# Patient Record
Sex: Female | Born: 1950 | Race: Black or African American | Hispanic: No | Marital: Married | Smoking: Never smoker
Health system: Southern US, Community
[De-identification: ages and names within clinical notes are randomized; demographics above are authoritative.]

## PROBLEM LIST (undated history)

## (undated) DIAGNOSIS — K829 Disease of gallbladder, unspecified: Secondary | ICD-10-CM

## (undated) DIAGNOSIS — M48061 Spinal stenosis, lumbar region without neurogenic claudication: Secondary | ICD-10-CM

## (undated) DIAGNOSIS — K219 Gastro-esophageal reflux disease without esophagitis: Secondary | ICD-10-CM

## (undated) DIAGNOSIS — R600 Localized edema: Secondary | ICD-10-CM

## (undated) DIAGNOSIS — M1710 Unilateral primary osteoarthritis, unspecified knee: Secondary | ICD-10-CM

## (undated) DIAGNOSIS — H02409 Unspecified ptosis of unspecified eyelid: Secondary | ICD-10-CM

## (undated) DIAGNOSIS — M2550 Pain in unspecified joint: Secondary | ICD-10-CM

## (undated) DIAGNOSIS — K589 Irritable bowel syndrome without diarrhea: Secondary | ICD-10-CM

## (undated) DIAGNOSIS — R0683 Snoring: Secondary | ICD-10-CM

## (undated) DIAGNOSIS — M549 Dorsalgia, unspecified: Secondary | ICD-10-CM

## (undated) DIAGNOSIS — I10 Essential (primary) hypertension: Secondary | ICD-10-CM

## (undated) DIAGNOSIS — R0602 Shortness of breath: Secondary | ICD-10-CM

## (undated) DIAGNOSIS — E559 Vitamin D deficiency, unspecified: Secondary | ICD-10-CM

## (undated) HISTORY — DX: Gastro-esophageal reflux disease without esophagitis: K21.9

## (undated) HISTORY — DX: Irritable bowel syndrome, unspecified: K58.9

## (undated) HISTORY — DX: Localized edema: R60.0

## (undated) HISTORY — DX: Spinal stenosis, lumbar region without neurogenic claudication: M48.061

## (undated) HISTORY — PX: BREAST EXCISIONAL BIOPSY: SUR124

## (undated) HISTORY — DX: Disease of gallbladder, unspecified: K82.9

## (undated) HISTORY — DX: Pain in unspecified joint: M25.50

## (undated) HISTORY — DX: Dorsalgia, unspecified: M54.9

## (undated) HISTORY — DX: Shortness of breath: R06.02

## (undated) HISTORY — DX: Essential (primary) hypertension: I10

## (undated) HISTORY — PX: ORTHOPEDIC SURGERY: SHX850

## (undated) HISTORY — DX: Snoring: R06.83

## (undated) HISTORY — DX: Unilateral primary osteoarthritis, unspecified knee: M17.10

## (undated) HISTORY — DX: Vitamin D deficiency, unspecified: E55.9

## (undated) HISTORY — DX: Unspecified ptosis of unspecified eyelid: H02.409

---

## 1969-04-16 HISTORY — PX: BREAST LUMPECTOMY: SHX2

## 2000-04-16 HISTORY — PX: PARTIAL HYSTERECTOMY: SHX80

## 2002-05-05 ENCOUNTER — Other Ambulatory Visit: Admission: RE | Admit: 2002-05-05 | Discharge: 2002-05-05 | Payer: Self-pay | Admitting: Gynecology

## 2003-04-17 HISTORY — PX: BUNIONECTOMY: SHX129

## 2011-12-10 ENCOUNTER — Other Ambulatory Visit: Payer: Self-pay | Admitting: Physician Assistant

## 2011-12-10 DIAGNOSIS — K8020 Calculus of gallbladder without cholecystitis without obstruction: Secondary | ICD-10-CM

## 2011-12-14 ENCOUNTER — Ambulatory Visit
Admission: RE | Admit: 2011-12-14 | Discharge: 2011-12-14 | Disposition: A | Discharge status: Home / Self Care | Payer: BC Managed Care – PPO | Source: Ambulatory Visit | Attending: Physician Assistant | Admitting: Physician Assistant

## 2011-12-14 DIAGNOSIS — K8020 Calculus of gallbladder without cholecystitis without obstruction: Secondary | ICD-10-CM

## 2012-01-17 ENCOUNTER — Encounter: Payer: Self-pay | Admitting: Obstetrics and Gynecology

## 2012-01-31 ENCOUNTER — Encounter: Payer: Self-pay | Admitting: Obstetrics and Gynecology

## 2012-01-31 ENCOUNTER — Ambulatory Visit (INDEPENDENT_AMBULATORY_CARE_PROVIDER_SITE_OTHER): Payer: BC Managed Care – PPO | Admitting: Obstetrics and Gynecology

## 2012-01-31 VITALS — BP 120/60 | Ht 63.0 in | Wt 218.0 lb

## 2012-01-31 DIAGNOSIS — R635 Abnormal weight gain: Secondary | ICD-10-CM

## 2012-01-31 DIAGNOSIS — N898 Other specified noninflammatory disorders of vagina: Secondary | ICD-10-CM

## 2012-01-31 DIAGNOSIS — Z139 Encounter for screening, unspecified: Secondary | ICD-10-CM

## 2012-01-31 LAB — CBC
HCT: 38.6 % (ref 36.0–46.0)
MCH: 29 pg (ref 26.0–34.0)
MCHC: 33.7 g/dL (ref 30.0–36.0)
MCV: 86.2 fL (ref 78.0–100.0)
RDW: 14.5 % (ref 11.5–15.5)

## 2012-01-31 LAB — COMPREHENSIVE METABOLIC PANEL
ALT: 12 U/L (ref 0–35)
BUN: 18 mg/dL (ref 6–23)
CO2: 25 mEq/L (ref 19–32)
Calcium: 9.6 mg/dL (ref 8.4–10.5)
Chloride: 108 mEq/L (ref 96–112)
Creat: 0.77 mg/dL (ref 0.50–1.10)
Glucose, Bld: 99 mg/dL (ref 70–99)

## 2012-01-31 LAB — POCT WET PREP (WET MOUNT)
Bacteria Wet Prep HPF POC: NEGATIVE
Clue Cells Wet Prep Whiff POC: NEGATIVE

## 2012-01-31 MED ORDER — VALACYCLOVIR HCL 500 MG PO TABS: 500.0000 mg | ORAL_TABLET | Freq: Two times a day (BID) | ORAL | Status: DC

## 2012-01-31 NOTE — Progress Notes (Signed)
Multiple complaints.  Reports h/o genital herpes.  Asks for referral to a PCP.  Filed Vitals:   01/31/12 0921  BP: 120/60    ROS: noncontributory  Pelvic exam:  VULVA: normal appearing vulva with no masses, tenderness or lesions,  VAGINA: normal appearing vagina with normal color and discharge, no lesions, CERVIX: normal appearing cervix without discharge or lesions,  UTERUS: uterus is normal size, shape, consistency and nontender,  ADNEXA: normal adnexa in size, nontender and no masses.  Results for orders placed in visit on 01/31/12  POCT WET PREP (WET [place])      Component Value Range   Source Wet Prep POC       WBC, Wet Prep HPF POC       Bacteria Wet Prep HPF POC neg     BACTERIA WET PREP MORPHOLOGY POC       Clue Cells Wet Prep HPF POC None     CLUE CELLS WET PREP WHIFF POC Negative Whiff     Yeast Wet Prep HPF POC None     KOH Wet Prep POC       Trichomonas Wet Prep HPF POC neg     pH 4.5       A/P Wet prep sched u/s Check labs RTO 1-2wks for u/s and f/u Trial of rephresh Refer to Dr. Allyne Gee

## 2012-02-04 ENCOUNTER — Telehealth: Payer: Self-pay

## 2012-02-04 NOTE — Telephone Encounter (Signed)
Notified pt of Vit D level , low at 27. Vit D softgels 50,000 units 1 PO q week x 12 weeks called to [facility], per protocol. Melody Comas A

## 2012-02-12 ENCOUNTER — Ambulatory Visit (INDEPENDENT_AMBULATORY_CARE_PROVIDER_SITE_OTHER): Payer: BC Managed Care – PPO | Admitting: Obstetrics and Gynecology

## 2012-02-12 ENCOUNTER — Encounter: Payer: Self-pay | Admitting: Obstetrics and Gynecology

## 2012-02-12 ENCOUNTER — Ambulatory Visit (INDEPENDENT_AMBULATORY_CARE_PROVIDER_SITE_OTHER): Payer: BC Managed Care – PPO

## 2012-02-12 ENCOUNTER — Other Ambulatory Visit: Payer: Self-pay | Admitting: Obstetrics and Gynecology

## 2012-02-12 ENCOUNTER — Ambulatory Visit: Payer: Self-pay | Admitting: Obstetrics and Gynecology

## 2012-02-12 VITALS — BP 108/60 | Ht 63.0 in | Wt 218.0 lb

## 2012-02-12 DIAGNOSIS — N952 Postmenopausal atrophic vaginitis: Secondary | ICD-10-CM

## 2012-02-12 DIAGNOSIS — N762 Acute vulvitis: Secondary | ICD-10-CM

## 2012-02-12 DIAGNOSIS — R635 Abnormal weight gain: Secondary | ICD-10-CM

## 2012-02-12 DIAGNOSIS — N760 Acute vaginitis: Secondary | ICD-10-CM

## 2012-02-12 MED ORDER — ESTROGENS, CONJUGATED 0.625 MG/GM VA CREA: TOPICAL_CREAM | Freq: Every day | VAGINAL | Status: DC

## 2012-02-12 NOTE — Progress Notes (Signed)
C/o d/c since last visit  Filed Vitals:   02/12/12 1115  BP: 108/60   U/S - uterus surgically absent Rt ov not seen and lt ovary 2.3cm  A/p Trial of premarin cream Pt has card to make appt with PCP - Dr. Allyne Gee F/u in 

## 2012-04-23 ENCOUNTER — Encounter: Payer: BC Managed Care – PPO | Admitting: Obstetrics and Gynecology

## 2013-03-20 HISTORY — PX: OTHER SURGICAL HISTORY: SHX169

## 2014-02-15 ENCOUNTER — Encounter: Payer: Self-pay | Admitting: Obstetrics and Gynecology

## 2014-04-30 ENCOUNTER — Other Ambulatory Visit: Payer: Self-pay | Admitting: Physician Assistant

## 2014-04-30 DIAGNOSIS — R1032 Left lower quadrant pain: Secondary | ICD-10-CM

## 2014-04-30 DIAGNOSIS — K8020 Calculus of gallbladder without cholecystitis without obstruction: Secondary | ICD-10-CM

## 2014-04-30 DIAGNOSIS — R140 Abdominal distension (gaseous): Secondary | ICD-10-CM

## 2014-04-30 DIAGNOSIS — R110 Nausea: Secondary | ICD-10-CM

## 2014-04-30 DIAGNOSIS — R1031 Right lower quadrant pain: Secondary | ICD-10-CM

## 2014-05-05 ENCOUNTER — Other Ambulatory Visit: Payer: Self-pay

## 2014-05-06 ENCOUNTER — Ambulatory Visit
Admission: RE | Admit: 2014-05-06 | Discharge: 2014-05-06 | Disposition: A | Discharge status: Home / Self Care | Payer: Commercial Managed Care - HMO | Source: Ambulatory Visit | Attending: Physician Assistant | Admitting: Physician Assistant

## 2014-05-06 DIAGNOSIS — R140 Abdominal distension (gaseous): Secondary | ICD-10-CM

## 2014-05-06 DIAGNOSIS — R1031 Right lower quadrant pain: Secondary | ICD-10-CM

## 2014-05-06 DIAGNOSIS — R1032 Left lower quadrant pain: Secondary | ICD-10-CM

## 2014-05-06 DIAGNOSIS — R110 Nausea: Secondary | ICD-10-CM

## 2014-05-06 DIAGNOSIS — K8020 Calculus of gallbladder without cholecystitis without obstruction: Secondary | ICD-10-CM

## 2014-05-06 MED ORDER — IOHEXOL 300 MG/ML  SOLN
125.0000 mL | Freq: Once | INTRAMUSCULAR | Status: AC | PRN
  Administered 2014-05-06: 125 mL via INTRAVENOUS

## 2015-09-22 ENCOUNTER — Encounter: Payer: Self-pay | Admitting: Cardiology

## 2015-09-23 ENCOUNTER — Ambulatory Visit (INDEPENDENT_AMBULATORY_CARE_PROVIDER_SITE_OTHER): Payer: Medicare HMO | Admitting: Physician Assistant

## 2015-09-23 ENCOUNTER — Encounter: Payer: Self-pay | Admitting: Physician Assistant

## 2015-09-23 VITALS — BP 102/78 | HR 72 | Ht 63.0 in | Wt 228.4 lb

## 2015-09-23 DIAGNOSIS — R079 Chest pain, unspecified: Secondary | ICD-10-CM

## 2015-09-23 DIAGNOSIS — E559 Vitamin D deficiency, unspecified: Secondary | ICD-10-CM

## 2015-09-23 DIAGNOSIS — I10 Essential (primary) hypertension: Secondary | ICD-10-CM

## 2015-09-23 DIAGNOSIS — R0683 Snoring: Secondary | ICD-10-CM

## 2015-09-23 DIAGNOSIS — R600 Localized edema: Secondary | ICD-10-CM

## 2015-09-23 MED ORDER — PANTOPRAZOLE SODIUM 40 MG PO TBEC: 40.0000 mg | DELAYED_RELEASE_TABLET | Freq: Every day | ORAL | Status: DC

## 2015-09-23 NOTE — Progress Notes (Signed)
Cardiology Office Note    Date:  09/23/2015   ID:  Olivia Simpson, DOB 08-16-1950, MRN [id number]  PCP:  Dema Severin, NP  Cardiologist:  New  Chief Complaint: Chest pain   History of Present Illness:   Olivia Simpson is a 65 y.o. female HTN and vitamin D deficiency who sent by PCP for evaluation of chest pain.   Stress test approximately 10 years ago was normal. Unable to specify reason. Echo 08/2014 for shortness of breath showed LV EF of 65-70%, mild concentric LVH, moderate dilated LA, mild aortic sclerosis, mild elevated central venous pressure.   She woke up from sleep last Friday night with "chest fluttering" that last for few second and resolved by itself. No associated symptoms. No reoccurrence however since then she has intermitted "chest discomfort" that she describes as "indigestion" at epigastric area. Her indigestion mostly occurs at rest.  Unable to specify severity and duration. She walks about 1 miles a day without SOB and chest pain. ? Recent fatigue with exertion. Denies hx of heartburn of GERD. She snores at night and husband has noted that she stops breathing. No hx of tobacco smoking. No family of heart disease. No recent travel of illicit drug use. She has noted intermittent ankle edema with salt intake.    Past Medical History  Diagnosis Date  . Hypertension   . Vitamin D deficiency     Past Surgical History  Procedure Laterality Date  . Partial hysterectomy  2002  . Bunionectomy Right 2005  . Breast lumpectomy  1971  . Orthopedic surgery Left   . Orthopedic surgery Right   . Hammertoe removal  03/20/13    Current Medications: Prior to Admission medications   Medication Sig Start Date End Date Taking? Authorizing Provider  aspirin 325 MG tablet Take 325 mg by mouth daily.    Historical Provider, MD  conjugated estrogens (PREMARIN) vaginal cream Place vaginally daily. 1/2 g to 1g pv qd x 2wks then 3x/wk x 2wks then 2x/wk 02/12/12   Osborn Coho,  MD  lisinopril-hydrochlorothiazide (PRINZIDE,ZESTORETIC) 20-12.5 MG per tablet Take 1 tablet by mouth daily.    Historical Provider, MD  valACYclovir (VALTREX) 500 MG tablet Take 1 tablet (500 mg total) by mouth 2 (two) times daily. 01/31/12   Osborn Coho, MD    Allergies:   Codeine and Sulfa antibiotics   Social History   Social History  . Marital Status: Married    Spouse Name: N/A  . Number of Children: N/A  . Years of Education: N/A   Social History Main Topics  . Smoking status: Never Smoker   . Smokeless tobacco: None  . Alcohol Use: No  . Drug Use: No  . Sexual Activity: Yes    Birth Control/ Protection: None, Post-menopausal     Comment: hysterectomy   Other Topics Concern  . None   Social History Narrative     Family History:  The patient's family history includes Colon cancer in her father; Diabetes in her brother and sister; Pancreatic cancer in her mother.   ROS:   Please see the history of present illness.    ROS All other systems reviewed and are negative.   PHYSICAL EXAM:   VS:  BP 102/78 mmHg  Pulse 72  Ht  (1.6 m)  Wt 228 lb 6.4 oz (103.602 kg)  BMI 40.47 kg/m2   GEN: Well nourished, well developed, in no acute distress HEENT: normal Neck: no JVD, carotid bruits, or masses  Cardiac:RRR; no murmurs, rubs, or gallops, very mild BL LE edema  Respiratory:  clear to auscultation bilaterally, normal work of breathing GI: soft, nontender, nondistended, + BS MS: no deformity or atrophy Skin: warm and dry, no rash Neuro:  Alert and Oriented x 3, Strength and sensation are intact Psych: euthymic mood, full affect  Wt Readings from Last 3 Encounters:  09/23/15 228 lb 6.4 oz (103.602 kg)  02/12/12 218 lb (98.884 kg)  01/31/12 218 lb (98.884 kg)      Studies/Labs Reviewed:   EKG:  EKG is not ordered today.  However reviewed EKG from PCP office showed NSR at rate of 59 bpm  Recent Labs at PCP office 09/20/15  Iron profile: Transferrin 233,  Iron 134, Ferritin 32, UIBC 170 Vitamin B12 334 CMP: Na 140, K 3.8, BUN 10, Cr 0.84, Albumin 3.9, AST 13, ALT 12 TSH: 1.3 CBC: WBC 5.8, Hgb 12.6, RDW 15.4 HgbA1C 5.7  Lipid Panel 09/20/15 LDL 97, HDL 56, Total chol 162, Triglycerides 67  Additional studies/ records that were reviewed today include:   AS noted above    ASSESSMENT & PLAN:    1. Chest pain  - Very atypical.  Describes as indigestion. Will try trial of Protonix. If no improvement --> will get stress test. If she has more chest fluttering --> will get 30 days event monitor. EKG is reassuring. No chest discomfort today.  - She does not have strong family hx of CAD.   2. Snoring - She does not want sleep evaluation currently. However will think and discuss during next office visit. Could be her fluttering episode due to apnea. Advised weight loss.   3. HTN - Stable and well controlled. Continue current regimen.   4. Intermittent LE edema - Seems diet related. Advised salt restriction. Try stocking. She is euvolemic today except very mild BL LE edema.  -  Echo 08/2014 for shortness of breath showed LV EF of 65-70%, mild concentric LVH, moderate dilated LA, mild aortic sclerosis, mild elevated central venous pressure.     Medication Adjustments/Labs and Tests Ordered: Current medicines are reviewed at length with the patient today.  Concerns regarding medicines are outlined above.  Medication changes, Labs and Tests ordered today are listed in the Patient Instructions below. Patient Instructions  Medication Instructions:  Your physician has recommended you make the following change in your medication:  1.  START Protonix 40 mg taking 1 tablet daily   Labwork: None ordered  Testing/Procedures:   Follow-Up: Your physician recommends that you schedule a follow-up appointment in: 10/24/2015 Arrive at 8:15   Any Other Special Instructions Will Be Listed Below (If Applicable).     If you need a refill on your  cardiac medications before your next appointment, please call your pharmacy.       Lorelei PontSigned, Hudson Lehmkuhl, [place]  09/23/2015 9:12 AM    [facility] [address], [place], [place]  [address] Phone: [phone]; Fax: [phone]   Pt seen and examined  I agree with findings as noted above by  ON exam Lungs CTA  Cardiac RRR  No S3  Ext with tr edema CP is atypical  Will give trial of PPI  If does not improve set up for stress test   If palpitations recur then consider event monitor  Dietrich PatesPaula Ross

## 2015-09-23 NOTE — Patient Instructions (Addendum)
Medication Instructions:  Your physician has recommended you make the following change in your medication:  1.  START Protonix 40 mg taking 1 tablet daily   Labwork: None ordered  Testing/Procedures:   Follow-Up: Your physician recommends that you schedule a follow-up appointment in: 10/24/2015 Arrive at 8:15   Any Other Special Instructions Will Be Listed Below (If Applicable).     If you need a refill on your cardiac medications before your next appointment, please call your pharmacy.

## 2015-10-24 ENCOUNTER — Ambulatory Visit: Payer: Medicare HMO | Admitting: Cardiology

## 2015-12-20 ENCOUNTER — Other Ambulatory Visit: Payer: Self-pay | Admitting: Physician Assistant

## 2016-05-01 ENCOUNTER — Other Ambulatory Visit: Payer: Self-pay | Admitting: Physician Assistant

## 2017-02-07 ENCOUNTER — Other Ambulatory Visit: Payer: Self-pay | Admitting: Family

## 2017-02-07 DIAGNOSIS — R5381 Other malaise: Secondary | ICD-10-CM

## 2017-02-08 ENCOUNTER — Other Ambulatory Visit: Payer: Self-pay | Admitting: Family

## 2017-02-08 DIAGNOSIS — E2839 Other primary ovarian failure: Secondary | ICD-10-CM

## 2017-02-12 ENCOUNTER — Other Ambulatory Visit: Payer: Self-pay | Admitting: Family

## 2017-02-12 DIAGNOSIS — E2839 Other primary ovarian failure: Secondary | ICD-10-CM

## 2017-02-12 DIAGNOSIS — E559 Vitamin D deficiency, unspecified: Secondary | ICD-10-CM

## 2017-06-06 ENCOUNTER — Other Ambulatory Visit: Payer: Self-pay | Admitting: Family

## 2017-06-06 DIAGNOSIS — M5416 Radiculopathy, lumbar region: Secondary | ICD-10-CM

## 2017-06-12 ENCOUNTER — Other Ambulatory Visit: Payer: Self-pay | Admitting: Family

## 2017-06-12 DIAGNOSIS — N6310 Unspecified lump in the right breast, unspecified quadrant: Secondary | ICD-10-CM

## 2017-06-12 DIAGNOSIS — R2231 Localized swelling, mass and lump, right upper limb: Secondary | ICD-10-CM

## 2017-06-17 ENCOUNTER — Ambulatory Visit
Admission: RE | Admit: 2017-06-17 | Discharge: 2017-06-17 | Disposition: A | Discharge status: Home / Self Care | Payer: Medicare HMO | Source: Ambulatory Visit | Attending: Family | Admitting: Family

## 2017-06-17 DIAGNOSIS — M5416 Radiculopathy, lumbar region: Secondary | ICD-10-CM

## 2017-06-21 ENCOUNTER — Ambulatory Visit
Admission: RE | Admit: 2017-06-21 | Discharge: 2017-06-21 | Disposition: A | Discharge status: Home / Self Care | Payer: Medicare HMO | Source: Ambulatory Visit | Attending: Family | Admitting: Family

## 2017-06-21 ENCOUNTER — Ambulatory Visit: Admission: RE | Admit: 2017-06-21 | Payer: Medicare HMO | Source: Ambulatory Visit

## 2017-06-21 DIAGNOSIS — N6310 Unspecified lump in the right breast, unspecified quadrant: Secondary | ICD-10-CM

## 2017-06-21 DIAGNOSIS — R2231 Localized swelling, mass and lump, right upper limb: Secondary | ICD-10-CM

## 2017-06-28 ENCOUNTER — Other Ambulatory Visit: Payer: Self-pay | Admitting: Family

## 2017-06-28 DIAGNOSIS — N281 Cyst of kidney, acquired: Secondary | ICD-10-CM

## 2017-07-09 ENCOUNTER — Ambulatory Visit
Admission: RE | Admit: 2017-07-09 | Discharge: 2017-07-09 | Disposition: A | Discharge status: Home / Self Care | Payer: Medicare HMO | Source: Ambulatory Visit | Attending: Family | Admitting: Family

## 2017-07-09 DIAGNOSIS — N281 Cyst of kidney, acquired: Secondary | ICD-10-CM

## 2017-10-28 ENCOUNTER — Other Ambulatory Visit: Payer: Self-pay | Admitting: Family

## 2017-10-28 DIAGNOSIS — R109 Unspecified abdominal pain: Secondary | ICD-10-CM

## 2017-10-28 DIAGNOSIS — R1030 Lower abdominal pain, unspecified: Secondary | ICD-10-CM

## 2017-11-05 ENCOUNTER — Ambulatory Visit
Admission: RE | Admit: 2017-11-05 | Discharge: 2017-11-05 | Disposition: A | Discharge status: Home / Self Care | Payer: Medicare HMO | Source: Ambulatory Visit | Attending: Family | Admitting: Family

## 2017-11-05 ENCOUNTER — Other Ambulatory Visit: Payer: Self-pay | Admitting: Family

## 2017-11-05 ENCOUNTER — Other Ambulatory Visit: Payer: Medicare HMO

## 2017-11-05 DIAGNOSIS — R1030 Lower abdominal pain, unspecified: Secondary | ICD-10-CM

## 2017-11-05 DIAGNOSIS — R109 Unspecified abdominal pain: Secondary | ICD-10-CM

## 2018-02-19 ENCOUNTER — Ambulatory Visit
Admission: RE | Admit: 2018-02-19 | Discharge: 2018-02-19 | Disposition: A | Discharge status: Home / Self Care | Payer: Medicare HMO | Source: Ambulatory Visit | Attending: Physician Assistant | Admitting: Physician Assistant

## 2018-02-19 ENCOUNTER — Other Ambulatory Visit: Payer: Self-pay | Admitting: Physician Assistant

## 2018-02-19 DIAGNOSIS — K5900 Constipation, unspecified: Secondary | ICD-10-CM

## 2018-04-01 ENCOUNTER — Other Ambulatory Visit: Payer: Self-pay | Admitting: Family

## 2018-04-01 DIAGNOSIS — Z1231 Encounter for screening mammogram for malignant neoplasm of breast: Secondary | ICD-10-CM

## 2018-04-14 ENCOUNTER — Other Ambulatory Visit: Payer: Self-pay | Admitting: Nurse Practitioner

## 2018-04-14 DIAGNOSIS — M79601 Pain in right arm: Secondary | ICD-10-CM

## 2018-04-18 ENCOUNTER — Ambulatory Visit
Admission: RE | Admit: 2018-04-18 | Discharge: 2018-04-18 | Disposition: A | Discharge status: Home / Self Care | Payer: Medicare HMO | Source: Ambulatory Visit | Attending: Nurse Practitioner | Admitting: Nurse Practitioner

## 2018-04-18 DIAGNOSIS — M79601 Pain in right arm: Secondary | ICD-10-CM

## 2018-04-27 ENCOUNTER — Emergency Department (HOSPITAL_COMMUNITY): Payer: Medicare HMO

## 2018-04-27 ENCOUNTER — Inpatient Hospital Stay (HOSPITAL_COMMUNITY)
Admission: EM | Admit: 2018-04-27 | Discharge: 2018-04-30 | DRG: 419 | Disposition: A | Discharge status: Home / Self Care | Payer: Medicare HMO | Attending: Surgery | Admitting: Surgery

## 2018-04-27 ENCOUNTER — Encounter (HOSPITAL_COMMUNITY): Payer: Self-pay | Admitting: Emergency Medicine

## 2018-04-27 DIAGNOSIS — I10 Essential (primary) hypertension: Secondary | ICD-10-CM | POA: Diagnosis present

## 2018-04-27 DIAGNOSIS — Z7982 Long term (current) use of aspirin: Secondary | ICD-10-CM

## 2018-04-27 DIAGNOSIS — K810 Acute cholecystitis: Secondary | ICD-10-CM | POA: Diagnosis present

## 2018-04-27 DIAGNOSIS — K8010 Calculus of gallbladder with chronic cholecystitis without obstruction: Secondary | ICD-10-CM | POA: Diagnosis not present

## 2018-04-27 DIAGNOSIS — I959 Hypotension, unspecified: Secondary | ICD-10-CM | POA: Diagnosis present

## 2018-04-27 DIAGNOSIS — E669 Obesity, unspecified: Secondary | ICD-10-CM | POA: Diagnosis present

## 2018-04-27 DIAGNOSIS — Z882 Allergy status to sulfonamides status: Secondary | ICD-10-CM

## 2018-04-27 DIAGNOSIS — K219 Gastro-esophageal reflux disease without esophagitis: Secondary | ICD-10-CM | POA: Diagnosis present

## 2018-04-27 DIAGNOSIS — Z885 Allergy status to narcotic agent status: Secondary | ICD-10-CM

## 2018-04-27 DIAGNOSIS — R1011 Right upper quadrant pain: Secondary | ICD-10-CM

## 2018-04-27 DIAGNOSIS — K819 Cholecystitis, unspecified: Secondary | ICD-10-CM | POA: Diagnosis present

## 2018-04-27 DIAGNOSIS — K8020 Calculus of gallbladder without cholecystitis without obstruction: Secondary | ICD-10-CM

## 2018-04-27 DIAGNOSIS — Z79899 Other long term (current) drug therapy: Secondary | ICD-10-CM

## 2018-04-27 LAB — COMPREHENSIVE METABOLIC PANEL
ALT: 14 U/L (ref 0–44)
AST: 17 U/L (ref 15–41)
Albumin: 3.9 g/dL (ref 3.5–5.0)
Alkaline Phosphatase: 44 U/L (ref 38–126)
Anion gap: 10 (ref 5–15)
BUN: 16 mg/dL (ref 8–23)
CO2: 24 mmol/L (ref 22–32)
Calcium: 9.3 mg/dL (ref 8.9–10.3)
Chloride: 103 mmol/L (ref 98–111)
Creatinine, Ser: 0.83 mg/dL (ref 0.44–1.00)
GFR calc Af Amer: >60 mL/min (ref >60)
GFR calc non Af Amer: >60 mL/min (ref >60)
Glucose, Bld: 136 mg/dL — ABNORMAL HIGH (ref 70–99)
Potassium: 3.7 mmol/L (ref 3.5–5.1)
Sodium: 137 mmol/L (ref 135–145)
Total Bilirubin: 0.6 mg/dL (ref 0.3–1.2)
Total Protein: 7.3 g/dL (ref 6.5–8.1)

## 2018-04-27 LAB — CBC WITH DIFFERENTIAL/PLATELET
Abs Immature Granulocytes: 0.04 10*3/uL (ref 0.00–0.07)
Basophils Absolute: 0 10*3/uL (ref 0.0–0.1)
Basophils Relative: 0 %
Eosinophils Absolute: 0 10*3/uL (ref 0.0–0.5)
Eosinophils Relative: 0 %
HCT: 40.9 % (ref 36.0–46.0)
Hemoglobin: 13.1 g/dL (ref 12.0–15.0)
Immature Granulocytes: 0 %
Lymphocytes Relative: 17 %
Lymphs Abs: 1.5 10*3/uL (ref 0.7–4.0)
MCH: 28.9 pg (ref 26.0–34.0)
MCHC: 32 g/dL (ref 30.0–36.0)
MCV: 90.3 fL (ref 80.0–100.0)
Monocytes Absolute: 0.4 10*3/uL (ref 0.1–1.0)
Monocytes Relative: 4 %
Neutro Abs: 7.2 10*3/uL (ref 1.7–7.7)
Neutrophils Relative %: 79 %
Platelets: 195 10*3/uL (ref 150–400)
RBC: 4.53 MIL/uL (ref 3.87–5.11)
RDW: 15 % (ref 11.5–15.5)
WBC: 9.2 10*3/uL (ref 4.0–10.5)
nRBC: 0 % (ref 0.0–0.2)

## 2018-04-27 LAB — LIPASE, BLOOD: Lipase: 33 U/L (ref 11–51)

## 2018-04-27 LAB — URINALYSIS, ROUTINE W REFLEX MICROSCOPIC
Bacteria, UA: NONE SEEN
Bilirubin Urine: NEGATIVE
Glucose, UA: NEGATIVE mg/dL
Hgb urine dipstick: NEGATIVE
Ketones, ur: NEGATIVE mg/dL
Nitrite: NEGATIVE
Protein, ur: NEGATIVE mg/dL
Specific Gravity, Urine: >1.046 — ABNORMAL HIGH (ref 1.005–1.030)
pH: 6 (ref 5.0–8.0)

## 2018-04-27 MED ORDER — HYDROMORPHONE HCL 1 MG/ML IJ SOLN
0.5000 mg | Freq: Once | INTRAMUSCULAR | Status: AC
  Administered 2018-04-27: 0.5 mg via INTRAVENOUS
  Filled 2018-04-27: qty 1

## 2018-04-27 MED ORDER — ONDANSETRON HCL 4 MG/2ML IJ SOLN
4.0000 mg | Freq: Once | INTRAMUSCULAR | Status: AC
  Administered 2018-04-27: 4 mg via INTRAVENOUS
  Filled 2018-04-27: qty 2

## 2018-04-27 MED ORDER — SODIUM CHLORIDE 0.9 % IV SOLN
2.0000 g | INTRAVENOUS | Status: DC
  Administered 2018-04-27 – 2018-04-29 (×3): 2 g via INTRAVENOUS
  Filled 2018-04-27 (×4): qty 20

## 2018-04-27 MED ORDER — LISINOPRIL 20 MG PO TABS
20.0000 mg | ORAL_TABLET | Freq: Every day | ORAL | Status: DC
  Administered 2018-04-28: 20 mg via ORAL
  Filled 2018-04-27: qty 1

## 2018-04-27 MED ORDER — MORPHINE SULFATE (PF) 2 MG/ML IV SOLN
1.0000 mg | INTRAVENOUS | Status: DC | PRN
  Administered 2018-04-27: 1 mg via INTRAVENOUS
  Filled 2018-04-27: qty 1

## 2018-04-27 MED ORDER — ENOXAPARIN SODIUM 40 MG/0.4ML ~~LOC~~ SOLN: 40.0000 mg | SUBCUTANEOUS | Status: DC

## 2018-04-27 MED ORDER — LISINOPRIL-HYDROCHLOROTHIAZIDE 20-12.5 MG PO TABS: 1.0000 | ORAL_TABLET | Freq: Every day | ORAL | Status: DC

## 2018-04-27 MED ORDER — ONDANSETRON HCL 4 MG/2ML IJ SOLN
4.0000 mg | Freq: Four times a day (QID) | INTRAMUSCULAR | Status: DC | PRN
  Administered 2018-04-28: 4 mg via INTRAVENOUS

## 2018-04-27 MED ORDER — IOHEXOL 300 MG/ML  SOLN
100.0000 mL | Freq: Once | INTRAMUSCULAR | Status: AC | PRN
  Administered 2018-04-27: 100 mL via INTRAVENOUS

## 2018-04-27 MED ORDER — ENSURE PRE-SURGERY PO LIQD
296.0000 mL | Freq: Once | ORAL | Status: AC
  Administered 2018-04-28: 296 mL via ORAL
  Filled 2018-04-27 (×2): qty 296

## 2018-04-27 MED ORDER — PANTOPRAZOLE SODIUM 40 MG PO TBEC
40.0000 mg | DELAYED_RELEASE_TABLET | Freq: Every day | ORAL | Status: DC
  Administered 2018-04-27 – 2018-04-30 (×3): 40 mg via ORAL
  Filled 2018-04-27 (×4): qty 1

## 2018-04-27 MED ORDER — SODIUM CHLORIDE 0.9 % IV SOLN
INTRAVENOUS | Status: DC
  Administered 2018-04-27 – 2018-04-30 (×3): via INTRAVENOUS

## 2018-04-27 MED ORDER — HYDROCHLOROTHIAZIDE 12.5 MG PO CAPS
12.5000 mg | ORAL_CAPSULE | Freq: Every day | ORAL | Status: DC
  Filled 2018-04-27: qty 1

## 2018-04-27 MED ORDER — ONDANSETRON 4 MG PO TBDP
4.0000 mg | ORAL_TABLET | Freq: Four times a day (QID) | ORAL | Status: DC | PRN
  Administered 2018-04-27: 4 mg via ORAL
  Filled 2018-04-27: qty 1

## 2018-04-27 MED ORDER — ACETAMINOPHEN 500 MG PO TABS
1000.0000 mg | ORAL_TABLET | ORAL | Status: AC
  Administered 2018-04-28: 1000 mg via ORAL
  Filled 2018-04-27: qty 2

## 2018-04-27 MED ORDER — GABAPENTIN 100 MG PO CAPS
100.0000 mg | ORAL_CAPSULE | ORAL | Status: AC
  Administered 2018-04-28: 100 mg via ORAL
  Filled 2018-04-27: qty 1

## 2018-04-27 MED ORDER — ACETAMINOPHEN 325 MG PO TABS
650.0000 mg | ORAL_TABLET | Freq: Four times a day (QID) | ORAL | Status: DC | PRN
  Administered 2018-04-29 (×2): 650 mg via ORAL
  Filled 2018-04-27 (×2): qty 2

## 2018-04-27 MED ORDER — ACETAMINOPHEN 650 MG RE SUPP: 650.0000 mg | Freq: Four times a day (QID) | RECTAL | Status: DC | PRN

## 2018-04-27 MED ORDER — KETOROLAC TROMETHAMINE 30 MG/ML IJ SOLN
30.0000 mg | Freq: Once | INTRAMUSCULAR | Status: AC
  Administered 2018-04-27: 30 mg via INTRAVENOUS
  Filled 2018-04-27: qty 1

## 2018-04-27 MED ORDER — OXYCODONE HCL 5 MG PO TABS
5.0000 mg | ORAL_TABLET | ORAL | Status: DC | PRN
  Administered 2018-04-27 – 2018-04-29 (×4): 10 mg via ORAL
  Filled 2018-04-27 (×3): qty 2

## 2018-04-27 MED ORDER — MORPHINE SULFATE (PF) 4 MG/ML IV SOLN
4.0000 mg | Freq: Once | INTRAVENOUS | Status: AC
  Administered 2018-04-27: 4 mg via INTRAVENOUS
  Filled 2018-04-27: qty 1

## 2018-04-27 NOTE — ED Notes (Signed)
Patient transported to US 

## 2018-04-27 NOTE — Progress Notes (Signed)
Patient arrived to [id number] with husband at bedside.  A&Ox4, VSS, IV intact and infusing.  Self ambulated from stretcher to bed.  Patient and spouse oriented to room and equipment.  Will continue to monitor.

## 2018-04-27 NOTE — H&P (Signed)
Olivia Simpson is an 68 y.o. female.   Chief Complaint: abd pain HPI: 20 yof with first episode ruq pain at 4 am today. Never had this before.  Started abruptly. Still sore although better after pain meds.  She has no n/v. Not hungry though. Having nl bms.  She was evaluated with [name] and ct that shows cholelithiasis and dilated cbd with possible choledocholithiasis but nl lfts. I was asked to see her.    Past Medical History:  Diagnosis Date  . Hypertension   . Snoring   . Vitamin D deficiency     Past Surgical History:  Procedure Laterality Date  . BREAST EXCISIONAL BIOPSY Right   . BREAST LUMPECTOMY  1971  . BUNIONECTOMY Right 2005  . hammertoe removal  03/20/13  . ORTHOPEDIC SURGERY Left   . ORTHOPEDIC SURGERY Right   . PARTIAL HYSTERECTOMY  2002    Family History  Problem Relation Age of Onset  . Colon cancer Father   . Pancreatic cancer Mother   . Diabetes Brother   . Diabetes Sister    Social History:  reports that she has never smoked. She does not have any smokeless tobacco history on file. She reports that she does not drink alcohol or use drugs.  Allergies:  Allergies  Allergen Reactions  . Codeine Nausea And Vomiting  . Sulfa Antibiotics Swelling    Meds reviewed,   Results for orders placed or performed during the hospital encounter of 04/27/18 (from the past 48 hour(s))  CBC with Differential     Status: None   Collection Time: 04/27/18  8:45 AM  Result Value Ref Range   WBC 9.2 4.0 - 10.5 K/uL   RBC 4.53 3.87 - 5.11 MIL/uL   Hemoglobin 13.1 12.0 - 15.0 g/dL   HCT 35.3 61.4 - 43.1 %   MCV 90.3 80.0 - 100.0 fL   MCH 28.9 26.0 - 34.0 pg   MCHC 32.0 30.0 - 36.0 g/dL   RDW 54.0 08.6 - 76.1 %   Platelets 195 150 - 400 K/uL   nRBC 0.0 0.0 - 0.2 %   Neutrophils Relative % 79 %   Neutro Abs 7.2 1.7 - 7.7 K/uL   Lymphocytes Relative 17 %   Lymphs Abs 1.5 0.7 - 4.0 K/uL   Monocytes Relative 4 %   Monocytes Absolute 0.4 0.1 - 1.0 K/uL   Eosinophils  Relative 0 %   Eosinophils Absolute 0.0 0.0 - 0.5 K/uL   Basophils Relative 0 %   Basophils Absolute 0.0 0.0 - 0.1 K/uL   Immature Granulocytes 0 %   Abs Immature Granulocytes 0.04 0.00 - 0.07 K/uL    Comment: Performed at [facility], [address] [place], [place] [address]  Comprehensive metabolic panel     Status: Abnormal   Collection Time: 04/27/18  8:45 AM  Result Value Ref Range   Sodium 137 135 - 145 mmol/L   Potassium 3.7 3.5 - 5.1 mmol/L   Chloride 103 98 - 111 mmol/L   CO2 24 22 - 32 mmol/L   Glucose, Bld 136 (H) 70 - 99 mg/dL   BUN 16 8 - 23 mg/dL   Creatinine, Ser 2.67 0.44 - 1.00 mg/dL   Calcium 9.3 8.9 - 12.4 mg/dL   Total Protein 7.3 6.5 - 8.1 g/dL   Albumin 3.9 3.5 - 5.0 g/dL   AST 17 15 - 41 U/L   ALT 14 0 - 44 U/L   Alkaline Phosphatase 44  38 - 126 U/L   Total Bilirubin 0.6 0.3 - 1.2 mg/dL   GFR calc non Af Amer >60 >60 mL/min   GFR calc Af Amer >60 >60 mL/min   Anion gap 10 5 - 15    Comment: Performed at [facility], [address], [place], [place] [address]  Lipase, blood     Status: None   Collection Time: 04/27/18  8:45 AM  Result Value Ref Range   Lipase 33 11 - 51 U/L    Comment: Performed at [facility], [address], [place], [place] [address]   Ct Abdomen Pelvis W Contrast  Result Date: 04/27/2018 CLINICAL DATA:  Acute onset diffuse abdominal pain with nausea and vomiting this morning. Cholelithiasis. EXAM: CT ABDOMEN AND PELVIS WITH CONTRAST TECHNIQUE: Multidetector CT imaging of the abdomen and pelvis was performed using the standard protocol following bolus administration of intravenous contrast. CONTRAST:  OMNIPAQUE IOHEXOL 300 MG/ML  SOLN COMPARISON:  Ultrasound on 04/27/2018, and CT on 05/06/2014 FINDINGS: Lower Chest: No acute findings. Sub-cm pulmonary nodules in both lung bases are stable compared to previous CT, consistent with benign postinflammatory etiology. Hepatobiliary: No hepatic masses identified.  Several small hepatic cysts are again seen in both the right and left lobes. Gallstones seen on recent ultrasound are not not well visualized by CT. No evidence of cholecystitis. Mild biliary ductal dilatation is seen with common bile duct measuring 13 mm. There is a tiny radiodensity seen in the distal common bile duct suspicious for choledocholithiasis. Pancreas:  No mass or inflammatory changes. Spleen: Within normal limits in size and appearance. Adrenals/Urinary Tract: No masses identified. A few tiny sub-cm left renal cysts are noted. No evidence of hydronephrosis. Stomach/Bowel: No evidence of obstruction, inflammatory process or abnormal fluid collections. Normal appendix visualized. Vascular/Lymphatic: No pathologically enlarged lymph nodes. No abdominal aortic aneurysm. Reproductive:  No mass or other significant abnormality. Other:  None. Musculoskeletal:  No suspicious bone lesions identified. IMPRESSION: Gallstones which are better demonstrated on recent ultrasound. No radiographic evidence of cholecystitis. Mild biliary ductal dilatation, with probable choledocholithiasis. Recommend correlation with liver function tests, and consider MRCP for further evaluation. Electronically Signed   By: Myles Rosenthal M.D.   On: 04/27/2018 11:23   US Abdomen Limited Ruq  Result Date: 04/27/2018 CLINICAL DATA:  Right upper quadrant pain. EXAM: ULTRASOUND ABDOMEN LIMITED RIGHT UPPER QUADRANT COMPARISON:  Abdominal ultrasound dated November 05, 2017. FINDINGS: Gallbladder: Multiple large gallstones again noted. No wall thickening visualized. Positive sonographic Murphy sign noted by sonographer. Common bile duct: Diameter: Dilated proximally, measuring 12 mm. Liver: No focal lesion identified. Within normal limits in parenchymal echogenicity. Portal vein is patent on color Doppler imaging with normal direction of blood flow towards the liver. IMPRESSION: 1. Cholelithiasis with positive sonographic Murphy sign. No  gallbladder wall thickening or pericholecystic fluid. Findings are equivocal for acute cholecystitis. 2. Proximal dilatation of the common bile duct. Correlate with LFTs and consider further evaluation with ERCP or MRCP as clinically indicated. Electronically Signed   By: Obie Dredge M.D.   On: 04/27/2018 11:18    Review of Systems  Gastrointestinal: Positive for abdominal pain.  All other systems reviewed and are negative.   Blood pressure 126/79, pulse 71, resp. rate 19, SpO2 100 %. Physical Exam  Vitals reviewed. Constitutional: She is oriented to person, place, and time. She appears well-developed and well-nourished.  HENT:  Head: Normocephalic and atraumatic.  Right Ear: External ear normal.  Left Ear: External  ear normal.  Eyes: Pupils are equal, round, and reactive to light. No scleral icterus.  Neck: Neck supple.  Cardiovascular: Normal rate, regular rhythm and normal heart sounds.  Respiratory: Effort normal and breath sounds normal. She has no wheezes.  GI: Soft. Bowel sounds are normal. She exhibits no distension. There is abdominal tenderness (mild ruq). No hernia.    Musculoskeletal: Normal range of motion.  Lymphadenopathy:    She has no cervical adenopathy.  Neurological: She is alert and oriented to person, place, and time.  Skin: Skin is warm and dry.  Psychiatric: She has a normal mood and affect. Her behavior is normal.     Assessment/Plan Symptomatic cholelithiasis  Will admit and plan for lap chole in am with Dr Corliss Skainssuei.  She may have choledocholithiasis on scan but with normal lfts would proceed with lap chole and cholangiogram.  I dont think needs mrcp.  . Will recheck lfts in am. Discussed lap chole with her as well as recovery and indications.   Emelia LoronMatthew Teonna Coonan, MD 04/27/2018, 12:14 PM

## 2018-04-27 NOTE — Progress Notes (Signed)
No SCD machines available in equipment room or with portable equipment but will receive as soon as one is available.  Will continue to monitor.

## 2018-04-27 NOTE — ED Provider Notes (Signed)
[facility] Provider Note   CSN: [id number] Arrival date & time: 04/27/18  [id number]     History   Chief Complaint Chief Complaint  Patient presents with  . Abdominal Pain    HPI Olivia Simpson is a 68 y.o. female with history of hypertension presents for evaluation of acute onset, constant upper abdominal pain beginning at around 4 AM this morning.  She reports the pain awoke her from her sleep.  The pain is constant, sharp, radiates from the right upper quadrant across the upper abdomen.  She has had one episode of nonbloody nonbilious emesis and 2 formed bowel movements.  Denies melena or hematochezia.  Denies urinary symptoms, fever, chills, chest pain, or shortness of breath.  No aggravating or alleviating factors noted.  She took 1 tablet of dicyclomine without relief of her symptoms.  Ate sauerkraut and potatoes last night.  [facility] for her colonoscopies in the past.  Has been told that she has gallstones.  The history is provided by the patient.    Past Medical History:  Diagnosis Date  . Hypertension   . Snoring   . Vitamin D deficiency     Patient Active Problem List   Diagnosis Date Noted  . Cholecystitis 04/27/2018  . Hypertension   . Vitamin D deficiency   . Snoring     Past Surgical History:  Procedure Laterality Date  . BREAST EXCISIONAL BIOPSY Right   . BREAST LUMPECTOMY  1971  . BUNIONECTOMY Right 2005  . hammertoe removal  03/20/13  . ORTHOPEDIC SURGERY Left   . ORTHOPEDIC SURGERY Right   . PARTIAL HYSTERECTOMY  2002     OB History    Gravida  2   Para  1   Term      Preterm      AB  1   Living  1     SAB  1   TAB      Ectopic      Multiple      Live Births               Home Medications    Prior to Admission medications   Medication Sig Start Date End Date Taking? Authorizing Provider  aspirin 81 MG tablet Take 81 mg by mouth daily.   Yes [provider]  ibuprofen (ADVIL,MOTRIN) 600 MG tablet Take 600 mg by mouth every 6 (six) hours as needed for headache or mild pain.   Yes [provider]  omeprazole (PRILOSEC) 20 MG capsule Take 20 mg by mouth daily.   Yes [provider]  lisinopril-hydrochlorothiazide (PRINZIDE,ZESTORETIC) 20-12.5 MG per tablet Take 1 tablet by mouth daily.    [provider]  pantoprazole (PROTONIX) 40 MG tablet TAKE 1 TABLET (40 MG TOTAL) BY MOUTH DAILY. Patient not taking: Reported on 04/27/2018 12/20/15   Manson PasseyBhagat, Bhavinkumar, PA    Family History Family History  Problem Relation Age of Onset  . Colon cancer Father   . Pancreatic cancer Mother   . Diabetes Brother   . Diabetes Sister     Social History Social History   Tobacco Use  . Smoking status: Never Smoker  Substance Use Topics  . Alcohol use: No  . Drug use: No     Allergies   Codeine and Sulfa antibiotics   Review of Systems Review of Systems  Constitutional: Negative for chills and fever.  Respiratory: Negative for shortness of breath.  Cardiovascular: Negative for chest pain.  Gastrointestinal: Positive for abdominal pain, nausea and vomiting. Negative for blood in stool, constipation and diarrhea.  Genitourinary: Negative for dysuria, hematuria and urgency.  All other systems reviewed and are negative.    Physical Exam Updated Vital Signs BP (!) 107/58   Pulse 80   Temp 98 F (36.7 C) (Oral)   Resp 18   SpO2 100%   Physical Exam Vitals signs and nursing note reviewed.  Constitutional:      General: She is not in acute distress.    Appearance: She is well-developed.     Comments: Appears uncomfortable   HENT:     Head: Normocephalic and atraumatic.  Eyes:     General:        Right eye: No discharge.        Left eye: No discharge.     Conjunctiva/sclera: Conjunctivae normal.  Neck:     Vascular: No JVD.     Trachea: No tracheal deviation.  Cardiovascular:     Rate and  Rhythm: Normal rate.     Heart sounds: Normal heart sounds.  Pulmonary:     Effort: Pulmonary effort is normal.     Breath sounds: Normal breath sounds.  Abdominal:     General: Abdomen is protuberant. Bowel sounds are decreased. There is no distension.     Palpations: Abdomen is soft.     Tenderness: There is abdominal tenderness in the right upper quadrant, epigastric area and left upper quadrant. There is guarding. There is no right CVA tenderness, left CVA tenderness or rebound. Positive signs include Murphy's sign. Negative signs include McBurney's sign.  Skin:    General: Skin is warm and dry.     Findings: No erythema.  Neurological:     Mental Status: She is alert.  Psychiatric:        Behavior: Behavior normal.      ED Treatments / Results  Labs (all labs ordered are listed, but only abnormal results are displayed) Labs Reviewed  COMPREHENSIVE METABOLIC PANEL - Abnormal; Notable for the following components:      Result Value   Glucose, Bld 136 (*)    All other components within normal limits  CBC WITH DIFFERENTIAL/PLATELET  LIPASE, BLOOD  URINALYSIS, ROUTINE W REFLEX MICROSCOPIC    EKG None  Radiology Ct Abdomen Pelvis W Contrast  Result Date: 04/27/2018 CLINICAL DATA:  Acute onset diffuse abdominal pain with nausea and vomiting this morning. Cholelithiasis. EXAM: CT ABDOMEN AND PELVIS WITH CONTRAST TECHNIQUE: Multidetector CT imaging of the abdomen and pelvis was performed using the standard protocol following bolus administration of intravenous contrast. CONTRAST:  OMNIPAQUE IOHEXOL 300 MG/ML  SOLN COMPARISON:  Ultrasound on 04/27/2018, and CT on 05/06/2014 FINDINGS: Lower Chest: No acute findings. Sub-cm pulmonary nodules in both lung bases are stable compared to previous CT, consistent with benign postinflammatory etiology. Hepatobiliary: No hepatic masses identified. Several small hepatic cysts are again seen in both the right and left lobes. Gallstones  seen on recent ultrasound are not not well visualized by CT. No evidence of cholecystitis. Mild biliary ductal dilatation is seen with common bile duct measuring 13 mm. There is a tiny radiodensity seen in the distal common bile duct suspicious for choledocholithiasis. Pancreas:  No mass or inflammatory changes. Spleen: Within normal limits in size and appearance. Adrenals/Urinary Tract: No masses identified. A few tiny sub-cm left renal cysts are noted. No evidence of hydronephrosis. Stomach/Bowel: No evidence of obstruction, inflammatory process or abnormal  fluid collections. Normal appendix visualized. Vascular/Lymphatic: No pathologically enlarged lymph nodes. No abdominal aortic aneurysm. Reproductive:  No mass or other significant abnormality. Other:  None. Musculoskeletal:  No suspicious bone lesions identified. IMPRESSION: Gallstones which are better demonstrated on recent ultrasound. No radiographic evidence of cholecystitis. Mild biliary ductal dilatation, with probable choledocholithiasis. Recommend correlation with liver function tests, and consider MRCP for further evaluation. Electronically Signed   By: Myles Rosenthal M.D.   On: 04/27/2018 11:23   US Abdomen Limited Ruq  Result Date: 04/27/2018 CLINICAL DATA:  Right upper quadrant pain. EXAM: ULTRASOUND ABDOMEN LIMITED RIGHT UPPER QUADRANT COMPARISON:  Abdominal ultrasound dated November 05, 2017. FINDINGS: Gallbladder: Multiple large gallstones again noted. No wall thickening visualized. Positive sonographic Murphy sign noted by sonographer. Common bile duct: Diameter: Dilated proximally, measuring 12 mm. Liver: No focal lesion identified. Within normal limits in parenchymal echogenicity. Portal vein is patent on color Doppler imaging with normal direction of blood flow towards the liver. IMPRESSION: 1. Cholelithiasis with positive sonographic Murphy sign. No gallbladder wall thickening or pericholecystic fluid. Findings are equivocal for acute  cholecystitis. 2. Proximal dilatation of the common bile duct. Correlate with LFTs and consider further evaluation with ERCP or MRCP as clinically indicated. Electronically Signed   By: Obie Dredge M.D.   On: 04/27/2018 11:18    Procedures Procedures (including critical care time)  Medications Ordered in ED Medications  enoxaparin (LOVENOX) injection 40 mg (has no administration in time range)  0.9 %  sodium chloride infusion (has no administration in time range)  cefTRIAXone (ROCEPHIN) 2 g in sodium chloride 0.9 % 100 mL IVPB (has no administration in time range)  acetaminophen (TYLENOL) tablet 650 mg (has no administration in time range)    Or  acetaminophen (TYLENOL) suppository 650 mg (has no administration in time range)  oxyCODONE (Oxy IR/ROXICODONE) immediate release tablet 5-10 mg (has no administration in time range)  morphine 2 MG/ML injection 1 mg (has no administration in time range)  ondansetron (ZOFRAN-ODT) disintegrating tablet 4 mg (has no administration in time range)    Or  ondansetron (ZOFRAN) injection 4 mg (has no administration in time range)  pantoprazole (PROTONIX) EC tablet 40 mg (has no administration in time range)  lisinopril (PRINIVIL,ZESTRIL) tablet 20 mg (has no administration in time range)  hydrochlorothiazide (MICROZIDE) capsule 12.5 mg (has no administration in time range)  morphine 4 MG/ML injection 4 mg (4 mg Intravenous Given 04/27/18 0850)  ondansetron (ZOFRAN) injection 4 mg (4 mg Intravenous Given 04/27/18 0850)  HYDROmorphone (DILAUDID) injection 0.5 mg (0.5 mg Intravenous Given 04/27/18 1058)  ondansetron (ZOFRAN) injection 4 mg (4 mg Intravenous Given 04/27/18 1059)  iohexol (OMNIPAQUE) 300 MG/ML solution 100 mL (100 mLs Intravenous Contrast Given 04/27/18 1023)  ketorolac (TORADOL) 30 MG/ML injection 30 mg (30 mg Intravenous Given 04/27/18 1118)     Initial Impression / Assessment and Plan / ED Course  I have reviewed the triage vital signs  and the nursing notes.  Pertinent labs & imaging results that were available during my care of the patient were reviewed by me and considered in my medical decision making (see chart for details).     Patient presenting for evaluation of sudden onset upper abdominal pain beginning 4 AM this morning.  Patient afebrile, vital signs are stable.  She appears uncomfortable but nontoxic in appearance.  She does have guarding on examination but no rigidity.  Will obtain lab work and imaging, give pain medicine, and reassess.  Lab work shows  no leukocytosis, no anemia, no metabolic derangements.  No renal insufficiency.  Lipase and LFTs within normal limits.  Right upper quadrant ultrasound showed cholelithiasis with positive sonographic Murphy sign.  No gallbladder wall thickening or pericholecystic fluid.  It also shows proximal dilatation of the common bile duct.  CT scan again shows gallstones but no evidence of cholecystitis.  Also notes mild biliary ductal dilatation with probable choledocholithiasis.  Spoke with Dr. Dwain SarnaWakefield with general surgery who recommends cholecystectomy and will see and assess the patient.  He agrees to assume care of her while she is in the hospital.  On reassessment, patient resting comfortably.  Her pain and nausea have been somewhat difficult to control while in the ED though she does note some improvement with medications.  I have discussed findings with patient and she agrees with plan at this time.  Final Clinical Impressions(s) / ED Diagnoses   Final diagnoses:  RUQ pain  Symptomatic cholelithiasis    ED Discharge Orders    None       Bennye AlmFawze, Arvin Abello A, PA-C 04/27/18 1249    Alvira MondaySchlossman, Erin, MD 05/02/18 1327

## 2018-04-27 NOTE — ED Triage Notes (Signed)
Belly pain and vomiting and nausea starting acutely this morning. Pain diffuse to upper quadrants. I episode of emesis. No diarr. Ate fast food last night.

## 2018-04-28 ENCOUNTER — Encounter (HOSPITAL_COMMUNITY): Payer: Self-pay | Admitting: Surgery

## 2018-04-28 ENCOUNTER — Other Ambulatory Visit: Payer: Self-pay

## 2018-04-28 ENCOUNTER — Observation Stay (HOSPITAL_COMMUNITY): Payer: Medicare HMO | Admitting: Certified Registered"

## 2018-04-28 ENCOUNTER — Encounter (HOSPITAL_COMMUNITY): Admission: EM | Disposition: A | Discharge status: Home / Self Care | Payer: Self-pay | Source: Home / Self Care

## 2018-04-28 DIAGNOSIS — K810 Acute cholecystitis: Secondary | ICD-10-CM | POA: Diagnosis present

## 2018-04-28 HISTORY — PX: CHOLECYSTECTOMY: SHX55

## 2018-04-28 LAB — COMPREHENSIVE METABOLIC PANEL
ALT: 15 U/L (ref 0–44)
AST: 15 U/L (ref 15–41)
Albumin: 3.4 g/dL — ABNORMAL LOW (ref 3.5–5.0)
Alkaline Phosphatase: 42 U/L (ref 38–126)
Anion gap: 9 (ref 5–15)
BUN: 9 mg/dL (ref 8–23)
CO2: 24 mmol/L (ref 22–32)
Calcium: 8.7 mg/dL — ABNORMAL LOW (ref 8.9–10.3)
Chloride: 102 mmol/L (ref 98–111)
Creatinine, Ser: 0.84 mg/dL (ref 0.44–1.00)
GFR calc Af Amer: >60 mL/min (ref >60)
Glucose, Bld: 136 mg/dL — ABNORMAL HIGH (ref 70–99)
Potassium: 3.3 mmol/L — ABNORMAL LOW (ref 3.5–5.1)
Sodium: 135 mmol/L (ref 135–145)
Total Bilirubin: 0.7 mg/dL (ref 0.3–1.2)
Total Protein: 6.7 g/dL (ref 6.5–8.1)

## 2018-04-28 LAB — SURGICAL PCR SCREEN
MRSA, PCR: NEGATIVE
STAPHYLOCOCCUS AUREUS: NEGATIVE

## 2018-04-28 SURGERY — LAPAROSCOPIC CHOLECYSTECTOMY WITH INTRAOPERATIVE CHOLANGIOGRAM
Anesthesia: General | Site: Abdomen

## 2018-04-28 MED ORDER — SODIUM CHLORIDE 0.9 % IV SOLN
2.0000 g | INTRAVENOUS | Status: DC
  Filled 2018-04-28: qty 20

## 2018-04-28 MED ORDER — EPHEDRINE SULFATE-NACL 50-0.9 MG/10ML-% IV SOSY
PREFILLED_SYRINGE | INTRAVENOUS | Status: DC | PRN
  Administered 2018-04-28: 5 mg via INTRAVENOUS
  Administered 2018-04-28 (×2): 10 mg via INTRAVENOUS

## 2018-04-28 MED ORDER — HYDROMORPHONE HCL 1 MG/ML IJ SOLN
INTRAMUSCULAR | Status: AC
  Administered 2018-04-28: 0.5 mg via INTRAVENOUS
  Filled 2018-04-28: qty 1

## 2018-04-28 MED ORDER — ONDANSETRON HCL 4 MG/2ML IJ SOLN
INTRAMUSCULAR | Status: AC
  Filled 2018-04-28: qty 2

## 2018-04-28 MED ORDER — ROCURONIUM BROMIDE 10 MG/ML (PF) SYRINGE
PREFILLED_SYRINGE | INTRAVENOUS | Status: DC | PRN
  Administered 2018-04-28: 30 mg via INTRAVENOUS
  Administered 2018-04-28: 50 mg via INTRAVENOUS

## 2018-04-28 MED ORDER — SUGAMMADEX SODIUM 200 MG/2ML IV SOLN
INTRAVENOUS | Status: DC | PRN
  Administered 2018-04-28: 400 mg via INTRAVENOUS

## 2018-04-28 MED ORDER — DEXAMETHASONE SODIUM PHOSPHATE 10 MG/ML IJ SOLN
INTRAMUSCULAR | Status: AC
  Filled 2018-04-28: qty 1

## 2018-04-28 MED ORDER — HYDROMORPHONE HCL 1 MG/ML IJ SOLN
0.2500 mg | INTRAMUSCULAR | Status: DC | PRN
  Administered 2018-04-28 (×4): 0.5 mg via INTRAVENOUS

## 2018-04-28 MED ORDER — LIDOCAINE 2% (20 MG/ML) 5 ML SYRINGE
INTRAMUSCULAR | Status: AC
  Filled 2018-04-28: qty 5

## 2018-04-28 MED ORDER — IBUPROFEN 600 MG PO TABS
600.0000 mg | ORAL_TABLET | Freq: Four times a day (QID) | ORAL | Status: DC | PRN
  Administered 2018-04-29 – 2018-04-30 (×2): 600 mg via ORAL
  Filled 2018-04-28 (×2): qty 1

## 2018-04-28 MED ORDER — EPHEDRINE 5 MG/ML INJ
INTRAVENOUS | Status: AC
  Filled 2018-04-28: qty 10

## 2018-04-28 MED ORDER — LACTATED RINGERS IV SOLN
INTRAVENOUS | Status: DC | PRN
  Administered 2018-04-28 (×2): via INTRAVENOUS

## 2018-04-28 MED ORDER — FENTANYL CITRATE (PF) 250 MCG/5ML IJ SOLN
INTRAMUSCULAR | Status: DC | PRN
  Administered 2018-04-28: 100 ug via INTRAVENOUS
  Administered 2018-04-28: 50 ug via INTRAVENOUS

## 2018-04-28 MED ORDER — FENTANYL CITRATE (PF) 250 MCG/5ML IJ SOLN
INTRAMUSCULAR | Status: AC
  Filled 2018-04-28: qty 5

## 2018-04-28 MED ORDER — MORPHINE SULFATE (PF) 2 MG/ML IV SOLN
2.0000 mg | INTRAVENOUS | Status: DC | PRN
  Administered 2018-04-28 (×5): 2 mg via INTRAVENOUS
  Filled 2018-04-28 (×5): qty 1

## 2018-04-28 MED ORDER — PHENYLEPHRINE 40 MCG/ML (10ML) SYRINGE FOR IV PUSH (FOR BLOOD PRESSURE SUPPORT)
PREFILLED_SYRINGE | INTRAVENOUS | Status: AC
  Filled 2018-04-28: qty 20

## 2018-04-28 MED ORDER — MEPERIDINE HCL 50 MG/ML IJ SOLN: 6.2500 mg | INTRAMUSCULAR | Status: DC | PRN

## 2018-04-28 MED ORDER — SODIUM CHLORIDE 0.9 % IR SOLN
Status: DC | PRN
  Administered 2018-04-28: 1000 mL

## 2018-04-28 MED ORDER — ENOXAPARIN SODIUM 40 MG/0.4ML ~~LOC~~ SOLN
40.0000 mg | SUBCUTANEOUS | Status: DC
  Administered 2018-04-29 – 2018-04-30 (×2): 40 mg via SUBCUTANEOUS
  Filled 2018-04-28 (×2): qty 0.4

## 2018-04-28 MED ORDER — ROCURONIUM BROMIDE 50 MG/5ML IV SOSY
PREFILLED_SYRINGE | INTRAVENOUS | Status: AC
  Filled 2018-04-28: qty 5

## 2018-04-28 MED ORDER — PHENYLEPHRINE 40 MCG/ML (10ML) SYRINGE FOR IV PUSH (FOR BLOOD PRESSURE SUPPORT)
PREFILLED_SYRINGE | INTRAVENOUS | Status: DC | PRN
  Administered 2018-04-28: 80 ug via INTRAVENOUS
  Administered 2018-04-28: 120 ug via INTRAVENOUS
  Administered 2018-04-28: 160 ug via INTRAVENOUS
  Administered 2018-04-28: 80 ug via INTRAVENOUS
  Administered 2018-04-28: 160 ug via INTRAVENOUS

## 2018-04-28 MED ORDER — BUPIVACAINE-EPINEPHRINE 0.25% -1:200000 IJ SOLN
INTRAMUSCULAR | Status: DC | PRN
  Administered 2018-04-28: 1 mL

## 2018-04-28 MED ORDER — IOPAMIDOL (ISOVUE-300) INJECTION 61%
INTRAVENOUS | Status: AC
  Filled 2018-04-28: qty 50

## 2018-04-28 MED ORDER — PROMETHAZINE HCL 25 MG/ML IJ SOLN: 6.2500 mg | INTRAMUSCULAR | Status: DC | PRN

## 2018-04-28 MED ORDER — PROPOFOL 10 MG/ML IV BOLUS
INTRAVENOUS | Status: DC | PRN
  Administered 2018-04-28: 140 mg via INTRAVENOUS

## 2018-04-28 MED ORDER — LACTATED RINGERS IV SOLN: INTRAVENOUS | Status: DC

## 2018-04-28 MED ORDER — BUPIVACAINE-EPINEPHRINE (PF) 0.25% -1:200000 IJ SOLN
INTRAMUSCULAR | Status: AC
  Filled 2018-04-28: qty 30

## 2018-04-28 MED ORDER — LIDOCAINE 2% (20 MG/ML) 5 ML SYRINGE
INTRAMUSCULAR | Status: DC | PRN
  Administered 2018-04-28: 60 mg via INTRAVENOUS

## 2018-04-28 MED ORDER — DEXAMETHASONE SODIUM PHOSPHATE 10 MG/ML IJ SOLN
INTRAMUSCULAR | Status: DC | PRN
  Administered 2018-04-28: 10 mg via INTRAVENOUS

## 2018-04-28 MED ORDER — PROPOFOL 10 MG/ML IV BOLUS
INTRAVENOUS | Status: AC
  Filled 2018-04-28: qty 20

## 2018-04-28 MED ORDER — OXYCODONE HCL 5 MG PO TABS
ORAL_TABLET | ORAL | Status: AC
  Filled 2018-04-28: qty 2

## 2018-04-28 MED ORDER — 0.9 % SODIUM CHLORIDE (POUR BTL) OPTIME
TOPICAL | Status: DC | PRN
  Administered 2018-04-28: 1000 mL

## 2018-04-28 SURGICAL SUPPLY — 46 items
APPLIER CLIP ROT 10 11.4 M/L (STAPLE) ×3
BENZOIN TINCTURE PRP APPL 2/3 (GAUZE/BANDAGES/DRESSINGS) ×3 IMPLANT
BLADE CLIPPER SURG (BLADE) IMPLANT
CANISTER SUCT 3000ML PPV (MISCELLANEOUS) ×3 IMPLANT
CHLORAPREP W/TINT 26ML (MISCELLANEOUS) ×3 IMPLANT
CLIP APPLIE ROT 10 11.4 M/L (STAPLE) ×1 IMPLANT
CLOSURE WOUND 1/2 X4 (GAUZE/BANDAGES/DRESSINGS) ×1
COVER MAYO STAND STRL (DRAPES) ×3 IMPLANT
COVER SURGICAL LIGHT HANDLE (MISCELLANEOUS) ×3 IMPLANT
COVER WAND RF STERILE (DRAPES) ×3 IMPLANT
DRAPE C-ARM 42X72 X-RAY (DRAPES) ×3 IMPLANT
DRSG TEGADERM 2-3/8X2-3/4 SM (GAUZE/BANDAGES/DRESSINGS) ×3 IMPLANT
DRSG TEGADERM 4X4.75 (GAUZE/BANDAGES/DRESSINGS) ×3 IMPLANT
ELECT REM PT RETURN 9FT ADLT (ELECTROSURGICAL) ×3
ELECTRODE REM PT RTRN 9FT ADLT (ELECTROSURGICAL) ×1 IMPLANT
FILTER SMOKE EVAC LAPAROSHD (FILTER) ×3 IMPLANT
GAUZE SPONGE 2X2 8PLY STRL LF (GAUZE/BANDAGES/DRESSINGS) ×2 IMPLANT
GLOVE BIO SURGEON STRL SZ7 (GLOVE) ×3 IMPLANT
GLOVE BIOGEL PI IND STRL 7.5 (GLOVE) ×1 IMPLANT
GLOVE BIOGEL PI INDICATOR 7.5 (GLOVE) ×2
GOWN STRL REUS W/ TWL LRG LVL3 (GOWN DISPOSABLE) ×3 IMPLANT
GOWN STRL REUS W/TWL LRG LVL3 (GOWN DISPOSABLE) ×6
HEMOSTAT SNOW SURGICEL 2X4 (HEMOSTASIS) ×3 IMPLANT
KIT BASIN OR (CUSTOM PROCEDURE TRAY) ×3 IMPLANT
KIT TURNOVER KIT B (KITS) ×3 IMPLANT
NS IRRIG 1000ML POUR BTL (IV SOLUTION) ×3 IMPLANT
PAD ARMBOARD 7.5X6 YLW CONV (MISCELLANEOUS) ×3 IMPLANT
POUCH RETRIEVAL ECOSAC 10 (ENDOMECHANICALS) ×1 IMPLANT
POUCH RETRIEVAL ECOSAC 10MM (ENDOMECHANICALS) ×2
POUCH SPECIMEN RETRIEVAL 10MM (ENDOMECHANICALS) IMPLANT
SCISSORS LAP 5X35 DISP (ENDOMECHANICALS) ×3 IMPLANT
SET CHOLANGIOGRAPH 5 50 .035 (SET/KITS/TRAYS/PACK) ×3 IMPLANT
SET IRRIG TUBING LAPAROSCOPIC (IRRIGATION / IRRIGATOR) ×3 IMPLANT
SET TUBE SMOKE EVAC HIGH FLOW (TUBING) ×3 IMPLANT
SLEEVE ENDOPATH XCEL 5M (ENDOMECHANICALS) ×3 IMPLANT
SPECIMEN JAR SMALL (MISCELLANEOUS) ×3 IMPLANT
SPONGE GAUZE 2X2 STER 10/PKG (GAUZE/BANDAGES/DRESSINGS) ×4
STRIP CLOSURE SKIN 1/2X4 (GAUZE/BANDAGES/DRESSINGS) ×2 IMPLANT
SUT MNCRL AB 4-0 PS2 18 (SUTURE) ×3 IMPLANT
TOWEL OR 17X24 6PK STRL BLUE (TOWEL DISPOSABLE) ×3 IMPLANT
TOWEL OR 17X26 10 PK STRL BLUE (TOWEL DISPOSABLE) ×3 IMPLANT
TRAY LAPAROSCOPIC MC (CUSTOM PROCEDURE TRAY) ×3 IMPLANT
TROCAR XCEL BLUNT TIP 100MML (ENDOMECHANICALS) ×3 IMPLANT
TROCAR XCEL NON-BLD 11X100MML (ENDOMECHANICALS) ×3 IMPLANT
TROCAR XCEL NON-BLD 5MMX100MML (ENDOMECHANICALS) ×3 IMPLANT
WATER STERILE IRR 1000ML POUR (IV SOLUTION) ×3 IMPLANT

## 2018-04-28 NOTE — Transfer of Care (Signed)
Immediate Anesthesia Transfer of Care Note  Patient: Olivia Simpson  Procedure(s) Performed: LAPAROSCOPIC CHOLECYSTECTOMY WITH INTRAOPERATIVE CHOLANGIOGRAM (N/A Abdomen)  Patient Location: PACU  Anesthesia Type:General  Level of Consciousness: awake, alert  and oriented  Airway & Oxygen Therapy: Patient Spontanous Breathing and Patient connected to face mask oxygen  Post-op Assessment: Report given to RN and Post -op Vital signs reviewed and stable  Post vital signs: Reviewed and stable  Last Vitals:  Vitals Value Taken Time  BP 105/69 04/28/2018  3:59 PM  Temp    Pulse 87 04/28/2018  3:59 PM  Resp 18 04/28/2018  3:59 PM  SpO2 98 % 04/28/2018  3:59 PM  Vitals shown include unvalidated device data.  Last Pain:  Vitals:   04/28/18 1107  TempSrc:   PainSc: 2       Patients Stated Pain Goal: 1 (04/28/18 0242)  Complications: No apparent anesthesia complications

## 2018-04-28 NOTE — Anesthesia Procedure Notes (Signed)
Procedure Name: Intubation Date/Time: 04/28/2018 2:49 PM Performed by: Teressa Lower., CRNA Pre-anesthesia Checklist: Patient identified, Emergency Drugs available, Suction available and Patient being monitored Patient Re-evaluated:Patient Re-evaluated prior to induction Oxygen Delivery Method: Circle system utilized Preoxygenation: Pre-oxygenation with 100% oxygen Induction Type: IV induction Ventilation: Mask ventilation without difficulty and Oral airway inserted - appropriate to patient size Laryngoscope Size: Mac and 3 Grade View: Grade II Tube type: Oral Tube size: 7.0 mm Number of attempts: 1 Airway Equipment and Method: Stylet and Oral airway Placement Confirmation: ETT inserted through vocal cords under direct vision,  positive ETCO2 and breath sounds checked- equal and bilateral Secured at: 22 cm Tube secured with: Tape Dental Injury: Teeth and Oropharynx as per pre-operative assessment  Comments: Grade 2B view

## 2018-04-28 NOTE — Anesthesia Preprocedure Evaluation (Signed)
Anesthesia Evaluation  Patient identified by MRN, date of birth, ID band Patient awake    Reviewed: Allergy & Precautions, NPO status , Patient's Chart, lab work & pertinent test results  Airway Mallampati: II  TM Distance: >3 FB Neck ROM: Full    Dental  (+) Partial Upper, Dental Advisory Given   Pulmonary neg pulmonary ROS,    breath sounds clear to auscultation       Cardiovascular hypertension, Pt. on medications  Rhythm:Regular Rate:Normal     Neuro/Psych negative neurological ROS  negative psych ROS   GI/Hepatic Neg liver ROS, GERD  Medicated,  Endo/Other  negative endocrine ROS  Renal/GU negative Renal ROS     Musculoskeletal negative musculoskeletal ROS (+)   Abdominal (+) + obese,   Peds  Hematology negative hematology ROS (+)   Anesthesia Other Findings   Reproductive/Obstetrics                             Anesthesia Physical Anesthesia Plan  ASA: III  Anesthesia Plan: General   Post-op Pain Management:    Induction: Intravenous  PONV Risk Score and Plan: 4 or greater and Ondansetron, Dexamethasone, Midazolam and Treatment may vary due to age or medical condition  Airway Management Planned: Oral ETT  Additional Equipment: None  Intra-op Plan:   Post-operative Plan: Extubation in OR  Informed Consent: I have reviewed the patients History and Physical, chart, labs and discussed the procedure including the risks, benefits and alternatives for the proposed anesthesia with the patient or authorized representative who has indicated his/her understanding and acceptance.   Dental advisory given  Plan Discussed with: CRNA  Anesthesia Plan Comments:         Anesthesia Quick Evaluation

## 2018-04-28 NOTE — Op Note (Signed)
Laparoscopic Cholecystectomy  Procedure Note  Indications: This patient presents with symptomatic gallbladder disease and will undergo laparoscopic cholecystectomy.  Pre-operative Diagnosis: Calculus of gallbladder with acute cholecystitis, without mention of obstruction  Post-operative Diagnosis: Same  Surgeon: Wynona LunaMatthew K Shataya Winkles   Assistants: Wells GuilesKelly Rayburn, PA-C  Anesthesia: General endotracheal anesthesia  ASA Class: 2  Procedure Details  The patient was seen again in the Holding Room. The risks, benefits, complications, treatment options, and expected outcomes were discussed with the patient. The possibilities of reaction to medication, pulmonary aspiration, perforation of viscus, bleeding, recurrent infection, finding a normal gallbladder, the need for additional procedures, failure to diagnose a condition, the possible need to convert to an open procedure, and creating a complication requiring transfusion or operation were discussed with the patient. The likelihood of improving the patient's symptoms with return to their baseline status is good.  The patient and/or family concurred with the proposed plan, giving informed consent. The site of surgery properly noted. The patient was taken to Operating Room, identified as Olivia Simpson and the procedure verified as Laparoscopic Cholecystectomy with Intraoperative Cholangiogram. A Time Out was held and the above information confirmed.  Prior to the induction of general anesthesia, antibiotic prophylaxis was administered. General endotracheal anesthesia was then administered and tolerated well. After the induction, the abdomen was prepped with Chloraprep and draped in the sterile fashion. The patient was positioned in the supine position.  Local anesthetic agent was injected into the skin below the umbilicus and an incision made. We dissected down to the abdominal fascia with blunt dissection.  Dissection was quite different due to her obesity.  The fascia was incised vertically and we entered the peritoneal cavity bluntly.  A pursestring suture of 0-Vicryl was placed around the fascial opening.  The Hasson cannula was inserted and secured with the stay suture.  Pneumoperitoneum was then created with CO2 and tolerated well without any adverse changes in the patient's vital signs. An 11-mm port was placed in the subxiphoid position.  Two 5-mm ports were placed in the right upper quadrant. All skin incisions were infiltrated with a local anesthetic agent before making the incision and placing the trocars.   We positioned the patient in reverse Trendelenburg, tilted slightly to the patient's left.  The gallbladder was identified and was quite thickened and distended.  The gallbladder was decompressed with the suction aspirator.  The fundus was grasped and retracted cephalad. Adhesions were lysed bluntly and with the electrocautery where indicated, taking care not to injure any adjacent organs or viscus. The infundibulum was grasped and retracted laterally, exposing the peritoneum overlying the triangle of Calot. This was then divided and exposed in a blunt fashion. A critical view of the cystic duct and cystic artery was obtained.  The cystic duct was clearly identified and bluntly dissected circumferentially. The cystic duct is very short and I felt it would be dangerous to try a cholangiogram.  The cystic duct was then ligated with clips and divided. The cystic artery was identified, dissected free, ligated with clips and divided as well.   The gallbladder was dissected from the liver bed in retrograde fashion with the electrocautery. The gallbladder was removed and placed in an Eco sac. The liver bed was irrigated and inspected. Hemostasis was achieved with the electrocautery and Surgicel SNOW. Copious irrigation was utilized and was repeatedly aspirated until clear.  The gallbladder and Eco sac were then removed through the umbilical port site.  The  pursestring suture was used to close  the umbilical fascia.    We again inspected the right upper quadrant for hemostasis.  Pneumoperitoneum was released as we removed the trocars.  4-0 Monocryl was used to close the skin.   Benzoin, steri-strips, and clean dressings were applied. The patient was then extubated and brought to the recovery room in stable condition. Instrument, sponge, and needle counts were correct at closure and at the conclusion of the case.   Findings: Cholecystitis with Cholelithiasis  Estimated Blood Loss: less than 100 mL         Drains: none         Specimens: Gallbladder           Complications: None; patient tolerated the procedure well.         Disposition: PACU - hemodynamically stable.         Condition: stable  Olivia ArmsMatthew K. Corliss Skainssuei, MD, [facility]  General/ Trauma Surgery Beeper [phone]  04/28/2018 3:38 PM

## 2018-04-28 NOTE — Progress Notes (Signed)
Subjective/Chief Complaint: Still with some right upper quadrant pain   Objective: Vital signs in last 24 hours: Temp:  [97.9 F (36.6 C)-99.1 F (37.3 C)] 99.1 F (37.3 C) (01/13 0356) Pulse Rate:  [70-85] 79 (01/13 0356) Resp:  [17-18] 18 (01/13 0356) BP: (92-109)/(31-70) 107/70 (01/13 0941) SpO2:  [93 %-100 %] 93 % (01/13 0356) Last BM Date: 04/27/18  Intake/Output from previous day: 01/12 0701 - 01/13 0700 In: 118 [I.V.:18; IV Piggyback:100] Out: -  Intake/Output this shift: No intake/output data recorded.  General appearance: alert, cooperative and no distress GI: soft, mildly tender in RUQ  Lab Results:  Recent Labs    04/27/18 0845  WBC 9.2  HGB 13.1  HCT 40.9  PLT 195   BMET Recent Labs    04/27/18 0845 04/28/18 0431  NA 137 135  K 3.7 3.3*  CL 103 102  CO2 24 24  GLUCOSE 136* 136*  BUN 16 9  CREATININE 0.83 0.84  CALCIUM 9.3 8.7*   Hepatic Function Latest Ref Rng & Units 04/28/2018 04/27/2018 01/31/2012  Total Protein 6.5 - 8.1 g/dL 6.7 7.3 7.0  Albumin 3.5 - 5.0 g/dL 3.4(L) 3.9 3.8  AST 15 - 41 U/L '15 17 14  '$ ALT 0 - 44 U/L '15 14 12  '$ Alk Phosphatase 38 - 126 U/L 42 44 52  Total Bilirubin 0.3 - 1.2 mg/dL 0.7 0.6 0.2(L)    PT/INR No results for input(s): LABPROT, INR in the last 72 hours. ABG No results for input(s): PHART, HCO3 in the last 72 hours.  Invalid input(s): PCO2, PO2  Studies/Results: Ct Abdomen Pelvis W Contrast  Result Date: 04/27/2018 CLINICAL DATA:  Acute onset diffuse abdominal pain with nausea and vomiting this morning. Cholelithiasis. EXAM: CT ABDOMEN AND PELVIS WITH CONTRAST TECHNIQUE: Multidetector CT imaging of the abdomen and pelvis was performed using the standard protocol following bolus administration of intravenous contrast. CONTRAST:  142m OMNIPAQUE IOHEXOL 300 MG/ML  SOLN COMPARISON:  Ultrasound on 04/27/2018, and CT on 05/06/2014 FINDINGS: Lower Chest: No acute findings. Sub-cm pulmonary nodules in both lung  bases are stable compared to previous CT, consistent with benign postinflammatory etiology. Hepatobiliary: No hepatic masses identified. Several small hepatic cysts are again seen in both the right and left lobes. Gallstones seen on recent ultrasound are not not well visualized by CT. No evidence of cholecystitis. Mild biliary ductal dilatation is seen with common bile duct measuring 13 mm. There is a tiny radiodensity seen in the distal common bile duct suspicious for choledocholithiasis. Pancreas:  No mass or inflammatory changes. Spleen: Within normal limits in size and appearance. Adrenals/Urinary Tract: No masses identified. A few tiny sub-cm left renal cysts are noted. No evidence of hydronephrosis. Stomach/Bowel: No evidence of obstruction, inflammatory process or abnormal fluid collections. Normal appendix visualized. Vascular/Lymphatic: No pathologically enlarged lymph nodes. No abdominal aortic aneurysm. Reproductive:  No mass or other significant abnormality. Other:  None. Musculoskeletal:  No suspicious bone lesions identified. IMPRESSION: Gallstones which are better demonstrated on recent ultrasound. No radiographic evidence of cholecystitis. Mild biliary ductal dilatation, with probable choledocholithiasis. Recommend correlation with liver function tests, and consider MRCP for further evaluation. Electronically Signed   By: JEarle GellM.D.   On: 04/27/2018 11:23   UKoreaAbdomen Limited Ruq  Result Date: 04/27/2018 CLINICAL DATA:  Right upper quadrant pain. EXAM: ULTRASOUND ABDOMEN LIMITED RIGHT UPPER QUADRANT COMPARISON:  Abdominal ultrasound dated November 05, 2017. FINDINGS: Gallbladder: Multiple large gallstones again noted. No wall thickening visualized. Positive sonographic MPercell Miller  sign noted by sonographer. Common bile duct: Diameter: Dilated proximally, measuring 12 mm. Liver: No focal lesion identified. Within normal limits in parenchymal echogenicity. Portal vein is patent on color Doppler  imaging with normal direction of blood flow towards the liver. IMPRESSION: 1. Cholelithiasis with positive sonographic Murphy sign. No gallbladder wall thickening or pericholecystic fluid. Findings are equivocal for acute cholecystitis. 2. Proximal dilatation of the common bile duct. Correlate with LFTs and consider further evaluation with ERCP or MRCP as clinically indicated. Electronically Signed   By: Titus Dubin M.D.   On: 04/27/2018 11:18    Anti-infectives: Anti-infectives (From admission, onward)   Start     Dose/Rate Route Frequency Ordered Stop   04/27/18 1245  cefTRIAXone (ROCEPHIN) 2 g in sodium chloride 0.9 % 100 mL IVPB     2 g 200 mL/hr over 30 Minutes Intravenous Every 24 hours 04/27/18 1237        Assessment/Plan: Symptomatic cholelithiasis  No sign of choledocholithiasis  Laparoscopic cholecystectomy with IOC today.  The surgical procedure has been discussed with the patient.  Potential risks, benefits, alternative treatments, and expected outcomes have been explained.  All of the patient's questions at this time have been answered.  The likelihood of reaching the patient's treatment goal is good.  The patient understand the proposed surgical procedure and wishes to proceed.   LOS: 1 day    Maia Petties 04/28/2018

## 2018-04-28 NOTE — Discharge Instructions (Signed)
[facility] LAPAROSCOPIC SURGERY: POST OP INSTRUCTIONS Always review your discharge instruction sheet given to you by the facility where your surgery was performed. IF YOU HAVE DISABILITY OR FAMILY LEAVE FORMS, YOU MUST BRING THEM TO THE OFFICE FOR PROCESSING.   DO NOT GIVE THEM TO YOUR DOCTOR.  PAIN CONTROL  1. First take acetaminophen (Tylenol) AND/or ibuprofen (Advil) to control your pain after surgery.  Follow directions on package.  Taking acetaminophen (Tylenol) and/or ibuprofen (Advil) regularly after surgery will help to control your pain and lower the amount of prescription pain medication you may need.  You should not take more than 3,000 mg (3 grams) of acetaminophen (Tylenol) in 24 hours.  You should not take ibuprofen (Advil), aleve, motrin, naprosyn or other NSAIDS if you have a history of stomach ulcers or chronic kidney disease.  2. A prescription for pain medication may be given to you upon discharge.  Take your pain medication as prescribed, if you still have uncontrolled pain after taking acetaminophen (Tylenol) or ibuprofen (Advil). 3. Use ice packs to help control pain. 4. If you need a refill on your pain medication, please contact your pharmacy.  They will contact our office to request authorization. Prescriptions will not be filled after 5pm or on week-ends.  HOME MEDICATIONS 5. Take your usually prescribed medications unless otherwise directed.  DIET 6. You should follow a light diet the first few days after arrival home.  Be sure to include lots of fluids daily. Avoid fatty, fried foods.   CONSTIPATION 7. It is common to experience some constipation after surgery and if you are taking pain medication.  Increasing fluid intake and taking a stool softener (such as Colace) will usually help or prevent this problem from occurring.  A mild laxative (Milk of Magnesia or Miralax) should be taken according to package instructions if there are no bowel  movements after 48 hours.  WOUND/INCISION CARE 8. Most patients will experience some swelling and bruising in the area of the incisions.  Ice packs will help.  Swelling and bruising can take several days to resolve.  9. Unless discharge instructions indicate otherwise, follow guidelines below  a. STERI-STRIPS - you may remove your outer bandages 48 hours after surgery, and you may shower at that time.  You have steri-strips (small skin tapes) in place directly over the incision.  These strips should be left on the skin for 7-10 days.   b. DERMABOND/SKIN GLUE - you may shower in 24 hours.  The glue will flake off over the next 2-3 weeks. 10. Any sutures or staples will be removed at the office during your follow-up visit.  ACTIVITIES 11. You may resume regular (light) daily activities beginning the next day--such as daily self-care, walking, climbing stairs--gradually increasing activities as tolerated.  You may have sexual intercourse when it is comfortable.  Refrain from any heavy lifting or straining until approved by your doctor. a. You may drive when you are no longer taking prescription pain medication, you can comfortably wear a seatbelt, and you can safely maneuver your car and apply brakes.  FOLLOW-UP 12. You should see your doctor in the office for a follow-up appointment approximately 2-3 weeks after your surgery.  You should have been given your post-op/follow-up appointment when your surgery was scheduled.  If you did not receive a post-op/follow-up appointment, make sure that you call for this appointment within a day or two after you arrive home to insure a convenient appointment time.  WHEN   TO CALL YOUR DOCTOR: 1. Fever over 101.0 2. Inability to urinate 3. Continued bleeding from incision. 4. Increased pain, redness, or drainage from the incision. 5. Increasing abdominal pain  The clinic staff is available to answer your questions during regular business hours.  Please don't  hesitate to call and ask to speak to one of the nurses for clinical concerns.  If you have a medical emergency, go to the nearest emergency room or call 911.  A surgeon from [facility] is always on call at the hospital. [address], [address], [place], [place]  [address] ? [address] [address], [place], [place]   [address] [phone] ? [phone] ? FAX [phone] Web site: [email or web address]  

## 2018-04-29 ENCOUNTER — Encounter (HOSPITAL_COMMUNITY): Payer: Self-pay | Admitting: Surgery

## 2018-04-29 DIAGNOSIS — K8010 Calculus of gallbladder with chronic cholecystitis without obstruction: Secondary | ICD-10-CM | POA: Diagnosis present

## 2018-04-29 DIAGNOSIS — E669 Obesity, unspecified: Secondary | ICD-10-CM | POA: Diagnosis present

## 2018-04-29 DIAGNOSIS — Z885 Allergy status to narcotic agent status: Secondary | ICD-10-CM | POA: Diagnosis not present

## 2018-04-29 DIAGNOSIS — I959 Hypotension, unspecified: Secondary | ICD-10-CM | POA: Diagnosis present

## 2018-04-29 DIAGNOSIS — Z7982 Long term (current) use of aspirin: Secondary | ICD-10-CM | POA: Diagnosis not present

## 2018-04-29 DIAGNOSIS — I10 Essential (primary) hypertension: Secondary | ICD-10-CM | POA: Diagnosis present

## 2018-04-29 DIAGNOSIS — K219 Gastro-esophageal reflux disease without esophagitis: Secondary | ICD-10-CM | POA: Diagnosis present

## 2018-04-29 DIAGNOSIS — Z882 Allergy status to sulfonamides status: Secondary | ICD-10-CM | POA: Diagnosis not present

## 2018-04-29 DIAGNOSIS — Z79899 Other long term (current) drug therapy: Secondary | ICD-10-CM | POA: Diagnosis not present

## 2018-04-29 LAB — COMPREHENSIVE METABOLIC PANEL
ALT: 29 U/L (ref 0–44)
AST: 41 U/L (ref 15–41)
Albumin: 3.1 g/dL — ABNORMAL LOW (ref 3.5–5.0)
Alkaline Phosphatase: 35 U/L — ABNORMAL LOW (ref 38–126)
Anion gap: 9 (ref 5–15)
BUN: 8 mg/dL (ref 8–23)
CHLORIDE: 103 mmol/L (ref 98–111)
CO2: 24 mmol/L (ref 22–32)
Calcium: 8.2 mg/dL — ABNORMAL LOW (ref 8.9–10.3)
Creatinine, Ser: 0.82 mg/dL (ref 0.44–1.00)
GFR calc Af Amer: >60 mL/min (ref >60)
GFR calc non Af Amer: >60 mL/min (ref >60)
Glucose, Bld: 130 mg/dL — ABNORMAL HIGH (ref 70–99)
Potassium: 3.3 mmol/L — ABNORMAL LOW (ref 3.5–5.1)
Sodium: 136 mmol/L (ref 135–145)
Total Bilirubin: 0.5 mg/dL (ref 0.3–1.2)
Total Protein: 6.4 g/dL — ABNORMAL LOW (ref 6.5–8.1)

## 2018-04-29 LAB — CBC
HCT: 33.2 % — ABNORMAL LOW (ref 36.0–46.0)
Hemoglobin: 11 g/dL — ABNORMAL LOW (ref 12.0–15.0)
MCH: 29.5 pg (ref 26.0–34.0)
MCHC: 33.1 g/dL (ref 30.0–36.0)
MCV: 89 fL (ref 80.0–100.0)
PLATELETS: 163 10*3/uL (ref 150–400)
RBC: 3.73 MIL/uL — ABNORMAL LOW (ref 3.87–5.11)
RDW: 15 % (ref 11.5–15.5)
WBC: 16.5 10*3/uL — ABNORMAL HIGH (ref 4.0–10.5)
nRBC: 0 % (ref 0.0–0.2)

## 2018-04-29 MED ORDER — POTASSIUM CHLORIDE CRYS ER 20 MEQ PO TBCR
20.0000 meq | EXTENDED_RELEASE_TABLET | Freq: Once | ORAL | Status: AC
  Administered 2018-04-29: 20 meq via ORAL
  Filled 2018-04-29: qty 1

## 2018-04-29 MED ORDER — SODIUM CHLORIDE 0.9 % IV BOLUS
500.0000 mL | Freq: Once | INTRAVENOUS | Status: AC
  Administered 2018-04-29: 500 mL via INTRAVENOUS

## 2018-04-29 MED ORDER — SODIUM CHLORIDE 0.9 % IV SOLN
Freq: Once | INTRAVENOUS | Status: AC
  Administered 2018-04-29: 03:00:00 via INTRAVENOUS

## 2018-04-29 NOTE — Care Management CC44 (Signed)
Condition Code 44 Documentation Completed  Patient Details  Name: LORRAINA PIGOTT MRN: [id number] Date of Birth: 1951/02/07   Condition Code 44 given:   yes Patient signature on Condition Code 44 notice:   yes Documentation of 2 MD's agreement:   yes Code 44 added to claim:   yes    Kingsley Plan, RN 04/29/2018, 11:29 AM

## 2018-04-29 NOTE — Progress Notes (Signed)
Pt had episode with decreased BP (88/53). No signs distress noted. Pulse 65. MD aware. Administered 1L NS Bolus. Lung sounds clear. BP back to baseline 108/74; Pulse 72.

## 2018-04-29 NOTE — Progress Notes (Signed)
Patient ID: Olivia Simpson, female   DOB: 03-15-1951, 68 y.o.   MRN: [id number]    1 Day Post-Op  Subjective: Pleasant this morning. Reports some soreness of her abdomen. No n/v. Tolerated water and jello last night. Mobilizing. Voiding. No flatus or BM. Has not used IS.   Objective: Vital signs in last 24 hours: Temp:  [97.3 F (36.3 C)-98.2 F (36.8 C)] 98.2 F (36.8 C) (01/14 0543) Pulse Rate:  [65-91] 68 (01/14 0543) Resp:  [16-24] 24 (01/14 0543) BP: (88-115)/(53-81) 89/57 (01/14 0543) SpO2:  [94 %-98 %] 96 % (01/14 0543) Last BM Date: 04/27/18  Intake/Output from previous day: 01/13 0701 - 01/14 0700 In: 1686.8 [I.V.:1686.8] Out: 5 [Blood:5] Intake/Output this shift: No intake/output data recorded.  PE: Heart: RRR Lungs CTA b/l Abd: Soft, ND. Appropriately tender. +BS. Tegaderm over incision. No surrounding erythema or noted drainage.   Lab Results:  Recent Labs    04/27/18 0845 04/29/18 0228  WBC 9.2 16.5*  HGB 13.1 11.0*  HCT 40.9 33.2*  PLT 195 163   BMET Recent Labs    04/28/18 0431 04/29/18 0228  NA 135 136  K 3.3* 3.3*  CL 102 103  CO2 24 24  GLUCOSE 136* 130*  BUN 9 8  CREATININE 0.84 0.82  CALCIUM 8.7* 8.2*   PT/INR No results for input(s): LABPROT, INR in the last 72 hours. CMP     Component Value Date/Time   NA 136 04/29/2018 0228   K 3.3 (L) 04/29/2018 0228   CL 103 04/29/2018 0228   CO2 24 04/29/2018 0228   GLUCOSE 130 (H) 04/29/2018 0228   BUN 8 04/29/2018 0228   CREATININE 0.82 04/29/2018 0228   CREATININE 0.77 01/31/2012 1014   CALCIUM 8.2 (L) 04/29/2018 0228   PROT 6.4 (L) 04/29/2018 0228   ALBUMIN 3.1 (L) 04/29/2018 0228   AST 41 04/29/2018 0228   ALT 29 04/29/2018 0228   ALKPHOS 35 (L) 04/29/2018 0228   BILITOT 0.5 04/29/2018 0228   GFRNONAA >60 04/29/2018 0228   GFRAA >60 04/29/2018 0228   Lipase     Component Value Date/Time   LIPASE 33 04/27/2018 0845       Studies/Results: Ct Abdomen Pelvis W  Contrast  Result Date: 04/27/2018 CLINICAL DATA:  Acute onset diffuse abdominal pain with nausea and vomiting this morning. Cholelithiasis. EXAM: CT ABDOMEN AND PELVIS WITH CONTRAST TECHNIQUE: Multidetector CT imaging of the abdomen and pelvis was performed using the standard protocol following bolus administration of intravenous contrast. CONTRAST:  OMNIPAQUE IOHEXOL 300 MG/ML  SOLN COMPARISON:  Ultrasound on 04/27/2018, and CT on 05/06/2014 FINDINGS: Lower Chest: No acute findings. Sub-cm pulmonary nodules in both lung bases are stable compared to previous CT, consistent with benign postinflammatory etiology. Hepatobiliary: No hepatic masses identified. Several small hepatic cysts are again seen in both the right and left lobes. Gallstones seen on recent ultrasound are not not well visualized by CT. No evidence of cholecystitis. Mild biliary ductal dilatation is seen with common bile duct measuring 13 mm. There is a tiny radiodensity seen in the distal common bile duct suspicious for choledocholithiasis. Pancreas:  No mass or inflammatory changes. Spleen: Within normal limits in size and appearance. Adrenals/Urinary Tract: No masses identified. A few tiny sub-cm left renal cysts are noted. No evidence of hydronephrosis. Stomach/Bowel: No evidence of obstruction, inflammatory process or abnormal fluid collections. Normal appendix visualized. Vascular/Lymphatic: No pathologically enlarged lymph nodes. No abdominal aortic aneurysm. Reproductive:  No mass or other significant  abnormality. Other:  None. Musculoskeletal:  No suspicious bone lesions identified. IMPRESSION: Gallstones which are better demonstrated on recent ultrasound. No radiographic evidence of cholecystitis. Mild biliary ductal dilatation, with probable choledocholithiasis. Recommend correlation with liver function tests, and consider MRCP for further evaluation. Electronically Signed   By: Myles RosenthalJohn  Stahl M.D.   On: 04/27/2018 11:23   [place] Abdomen  Limited Ruq  Result Date: 04/27/2018 CLINICAL DATA:  Right upper quadrant pain. EXAM: ULTRASOUND ABDOMEN LIMITED RIGHT UPPER QUADRANT COMPARISON:  Abdominal ultrasound dated November 05, 2017. FINDINGS: Gallbladder: Multiple large gallstones again noted. No wall thickening visualized. Positive sonographic Murphy sign noted by sonographer. Common bile duct: Diameter: Dilated proximally, measuring 12 mm. Liver: No focal lesion identified. Within normal limits in parenchymal echogenicity. Portal vein is patent on color Doppler imaging with normal direction of blood flow towards the liver. IMPRESSION: 1. Cholelithiasis with positive sonographic Murphy sign. No gallbladder wall thickening or pericholecystic fluid. Findings are equivocal for acute cholecystitis. 2. Proximal dilatation of the common bile duct. Correlate with LFTs and consider further evaluation with ERCP or MRCP as clinically indicated. Electronically Signed   By: Obie DredgeWilliam T Derry M.D.   On: 04/27/2018 11:18    Anti-infectives: Anti-infectives (From admission, onward)   Start     Dose/Rate Route Frequency Ordered Stop   04/28/18 1400  cefTRIAXone (ROCEPHIN) 2 g in sodium chloride 0.9 % 100 mL IVPB  Status:  Discontinued     2 g 200 mL/hr over 30 Minutes Intravenous To [facility] 04/28/18 1345 04/28/18 1707   04/27/18 1245  cefTRIAXone (ROCEPHIN) 2 g in sodium chloride 0.9 % 100 mL IVPB     2 g 200 mL/hr over 30 Minutes Intravenous Every 24 hours 04/27/18 1237         Assessment/Plan POD 1 s/p Lap Chole for Cholecystitis, Dr. Corliss Skainssuei (1/13) -Tolerating PO. Will advance diet -Voiding -Mobilize and IS  -2 episodes of hypotension overnight(01:57, 05:43). On maintenance fluids. Will give 500ml bolus. Monitor -WBC increased 9.2 >> 16.5. Monitor  -LFTs wnl.  -Replace K -Repeat labs in AM.   HTN -Will hold home meds for now given hypotension  FEN - Heart Healthy VTE - Lovenox ID - Rocephin 1/12 >>   LOS: 1 day    Jacinto HalimMichael M   , [facility] 04/29/2018, 8:19 AM Pager: [phone]

## 2018-04-30 LAB — CBC
HCT: 31 % — ABNORMAL LOW (ref 36.0–46.0)
Hemoglobin: 9.9 g/dL — ABNORMAL LOW (ref 12.0–15.0)
MCH: 28.5 pg (ref 26.0–34.0)
MCHC: 31.9 g/dL (ref 30.0–36.0)
MCV: 89.3 fL (ref 80.0–100.0)
Platelets: 144 10*3/uL — ABNORMAL LOW (ref 150–400)
RBC: 3.47 MIL/uL — ABNORMAL LOW (ref 3.87–5.11)
RDW: 15.3 % (ref 11.5–15.5)
WBC: 10.7 10*3/uL — ABNORMAL HIGH (ref 4.0–10.5)
nRBC: 0 % (ref 0.0–0.2)

## 2018-04-30 LAB — COMPREHENSIVE METABOLIC PANEL
ALT: 31 U/L (ref 0–44)
AST: 28 U/L (ref 15–41)
Albumin: 2.7 g/dL — ABNORMAL LOW (ref 3.5–5.0)
Alkaline Phosphatase: 33 U/L — ABNORMAL LOW (ref 38–126)
Anion gap: 10 (ref 5–15)
BUN: 12 mg/dL (ref 8–23)
CALCIUM: 7.9 mg/dL — AB (ref 8.9–10.3)
CO2: 23 mmol/L (ref 22–32)
Chloride: 109 mmol/L (ref 98–111)
Creatinine, Ser: 0.88 mg/dL (ref 0.44–1.00)
GFR calc non Af Amer: >60 mL/min (ref >60)
Glucose, Bld: 100 mg/dL — ABNORMAL HIGH (ref 70–99)
Potassium: 3.5 mmol/L (ref 3.5–5.1)
Sodium: 142 mmol/L (ref 135–145)
Total Bilirubin: 0.7 mg/dL (ref 0.3–1.2)
Total Protein: 5.6 g/dL — ABNORMAL LOW (ref 6.5–8.1)

## 2018-04-30 MED ORDER — ACETAMINOPHEN 325 MG PO TABS: 650.0000 mg | ORAL_TABLET | Freq: Four times a day (QID) | ORAL | Status: DC | PRN

## 2018-04-30 MED ORDER — OXYCODONE HCL 5 MG PO TABS: 5.0000 mg | ORAL_TABLET | Freq: Four times a day (QID) | ORAL | 0 refills | Status: DC | PRN

## 2018-04-30 NOTE — Discharge Summary (Addendum)
    Patient ID: Olivia Simpson [id number] [birth year] 68 y.o.  Admit date: 04/27/2018 Discharge date: 04/30/2018  Admitting Diagnosis:  Symptomatic cholelithiasis   Discharge Diagnosis Patient Active Problem List   Diagnosis Date Noted  . Acute cholecystitis 04/28/2018  . Cholecystitis 04/27/2018  . Hypertension   . Vitamin D deficiency   . Snoring     Consultants None  Reason for Admission: 20 yof with first episode ruq pain at 4 am today. Never had this before.  Started abruptly. Still sore although better after pain meds.  She has no n/v. Not hungry though. Having nl bms.  She was evaluated with [name] and ct that shows cholelithiasis and dilated cbd with possible choledocholithiasis but nl lfts.   Procedures Laparoscopic Cholecystectomy   Hospital Course:  The patient was admitted and underwent a laparoscopic cholecystectomy. The patient tolerated the procedure well.  On POD 1, the patient was tolerating a clear diet, voiding well, mobilizing, and pain was controlled with oral pain medications. She was watched overnight after episodes of hypotension overnight. Her BP medication was held and she was given fluids with improvement. On POD 2, bilirubin wnl, and LFTs & wbc continue to downtrend. She was tolerating a regular diet, mobilizing halls, passing flatus, voiding, and pain was controlled with oral pain medications. There were no episodes of hypotension overnight. The patient was stable for DC home at this time with appropriate follow up made.   Physical Exam: Heart: RRR Lungs: CTA b/l Abd: Soft, ND. Appropriately tender. +BS. Tegaderm over incisions. No surrounding erythema or noted drainage.   Allergies as of 04/30/2018      Reactions   Sulfa Antibiotics Swelling   SWELLING REACTION UNSPECIFIED    Codeine Nausea And Vomiting      Medication List    TAKE these medications   acetaminophen 325 MG tablet Commonly known as:  TYLENOL Take 2 tablets (650 mg total) by  mouth every 6 (six) hours as needed for mild pain (or temp > 100).   aspirin 81 MG tablet Take 81 mg by mouth daily.   ibuprofen 600 MG tablet Commonly known as:  ADVIL,MOTRIN Take 600 mg by mouth every 6 (six) hours as needed for headache or mild pain.   lisinopril-hydrochlorothiazide 20-12.5 MG tablet Commonly known as:  PRINZIDE,ZESTORETIC Take 1 tablet by mouth daily.   omeprazole 20 MG capsule Commonly known as:  PRILOSEC Take 20 mg by mouth daily.   oxyCODONE 5 MG immediate release tablet Commonly known as:  Oxy IR/ROXICODONE Take 1 tablet (5 mg total) by mouth every 6 (six) hours as needed for severe pain.   pantoprazole 40 MG tablet Commonly known as:  PROTONIX TAKE 1 TABLET (40 MG TOTAL) BY MOUTH DAILY.        Follow-up Information    [facility]. Go on 05/15/2018.   Specialty:  General Surgery Why:  Follow up appointment scheduled for 1:30 PM. Please arrive 30 min prior to appointment time. Bring photo ID and insurance information.  Contact information: [address] [place] [address] [phone]           Signed: Leary Roca, [facility] 04/30/2018, 8:13 AM Pager: [phone]

## 2018-04-30 NOTE — Progress Notes (Signed)
Patient discharged to home. Verbalizes understanding of all discharge instructions including incision care, discharge medications, and follow up MD visits.  

## 2018-04-30 NOTE — Plan of Care (Signed)

## 2018-05-01 NOTE — Anesthesia Postprocedure Evaluation (Signed)
Anesthesia Post Note  Patient: Olivia Simpson  Procedure(s) Performed: LAPAROSCOPIC CHOLECYSTECTOMY WITH INTRAOPERATIVE CHOLANGIOGRAM (N/A Abdomen)     Patient location during evaluation: PACU Anesthesia Type: General Level of consciousness: awake and alert Pain management: pain level controlled Vital Signs Assessment: post-procedure vital signs reviewed and stable Respiratory status: spontaneous breathing, nonlabored ventilation, respiratory function stable and patient connected to nasal cannula oxygen Cardiovascular status: blood pressure returned to baseline and stable Postop Assessment: no apparent nausea or vomiting Anesthetic complications: no    Last Vitals:  Vitals:   04/29/18 2308 04/30/18 0559  BP: 121/68 125/86  Pulse: 77 71  Resp: 20 20  Temp: 36.9 C 36.8 C  SpO2: 97% 96%    Last Pain:  Vitals:   04/30/18 0850  TempSrc:   PainSc: 3                  Shelton SilvasKevin D Hollis

## 2018-05-09 ENCOUNTER — Ambulatory Visit: Payer: Medicare HMO

## 2018-07-21 ENCOUNTER — Other Ambulatory Visit: Payer: Self-pay | Admitting: Family

## 2018-07-21 DIAGNOSIS — G43909 Migraine, unspecified, not intractable, without status migrainosus: Secondary | ICD-10-CM

## 2018-07-23 ENCOUNTER — Other Ambulatory Visit: Payer: Self-pay

## 2018-07-23 ENCOUNTER — Ambulatory Visit
Admission: RE | Admit: 2018-07-23 | Discharge: 2018-07-23 | Disposition: A | Discharge status: Home / Self Care | Payer: Medicare HMO | Source: Ambulatory Visit | Attending: Family | Admitting: Family

## 2018-07-23 DIAGNOSIS — G43909 Migraine, unspecified, not intractable, without status migrainosus: Secondary | ICD-10-CM

## 2018-10-04 ENCOUNTER — Ambulatory Visit (HOSPITAL_COMMUNITY)
Admission: EM | Admit: 2018-10-04 | Discharge: 2018-10-04 | Disposition: A | Discharge status: Home / Self Care | Payer: Medicare HMO | Attending: Internal Medicine | Admitting: Internal Medicine

## 2018-10-04 ENCOUNTER — Other Ambulatory Visit: Payer: Self-pay

## 2018-10-04 ENCOUNTER — Encounter (HOSPITAL_COMMUNITY): Payer: Self-pay

## 2018-10-04 DIAGNOSIS — M1711 Unilateral primary osteoarthritis, right knee: Secondary | ICD-10-CM

## 2018-10-04 DIAGNOSIS — G8929 Other chronic pain: Secondary | ICD-10-CM | POA: Diagnosis not present

## 2018-10-04 DIAGNOSIS — M25561 Pain in right knee: Secondary | ICD-10-CM

## 2018-10-04 MED ORDER — TRAMADOL HCL 50 MG PO TABS: 50.0000 mg | ORAL_TABLET | Freq: Four times a day (QID) | ORAL | 0 refills | Status: DC | PRN

## 2018-10-04 NOTE — ED Provider Notes (Addendum)
[facility]    CSN: [id number] Arrival date & time: 10/04/18  1448     History   Chief Complaint Chief Complaint  Patient presents with  . Knee Pain    HPI Olivia Simpson is a 68 y.o. female history of hypertension, obesity, chronic bilateral knee pain presenting for acute right knee pain.  Patient states that she woke up this morning and her knee is more swollen and painful.  Patient is able to ambulate, though endorses pain when she does so.  Patient denies inciting event such as fall, trauma to the area.  Previously followed by orthopedics and states told her she needed a total right knee replacement.  Patient states surgery was delayed due to coronavirus, has follow-up pending.  She has tried ibuprofen with minimal relief, wondering if she can have anything stronger.  Patient denies recent surgeries, history of blood clot, lower extremity edema or erythema cough, shortness of breath.    Past Medical History:  Diagnosis Date  . Hypertension   . Snoring   . Vitamin D deficiency     Patient Active Problem List   Diagnosis Date Noted  . Acute cholecystitis 04/28/2018  . Cholecystitis 04/27/2018  . Hypertension   . Vitamin D deficiency   . Snoring     Past Surgical History:  Procedure Laterality Date  . BREAST EXCISIONAL BIOPSY Right   . BREAST LUMPECTOMY  1971  . BUNIONECTOMY Right 2005  . CHOLECYSTECTOMY N/A 04/28/2018   Procedure: LAPAROSCOPIC CHOLECYSTECTOMY WITH INTRAOPERATIVE CHOLANGIOGRAM;  Surgeon: Donnie Mesa, MD;  Location: [facility];  Service: General;  Laterality: N/A;  . hammertoe removal  03/20/13  . ORTHOPEDIC SURGERY Left   . ORTHOPEDIC SURGERY Right   . PARTIAL HYSTERECTOMY  2002    OB History    Gravida  2   Para  1   Term      Preterm      AB  1   Living  1     SAB  1   TAB      Ectopic      Multiple      Live Births               Home Medications    Prior to Admission medications   Medication Sig Start  Date End Date Taking? Authorizing Provider  acetaminophen (TYLENOL) 325 MG tablet Take 2 tablets (650 mg total) by mouth every 6 (six) hours as needed for mild pain (or temp > 100). 04/30/18   Maczis, Barth Kirks, PA-C  aspirin 81 MG tablet Take 81 mg by mouth daily.    [provider]  ibuprofen (ADVIL,MOTRIN) 600 MG tablet Take 600 mg by mouth every 6 (six) hours as needed for headache or mild pain.    [provider]  lisinopril-hydrochlorothiazide (PRINZIDE,ZESTORETIC) 20-12.5 MG per tablet Take 1 tablet by mouth daily.    [provider]  omeprazole (PRILOSEC) 20 MG capsule Take 20 mg by mouth daily.    [provider]  oxyCODONE (OXY IR/ROXICODONE) 5 MG immediate release tablet Take 1 tablet (5 mg total) by mouth every 6 (six) hours as needed for severe pain. 04/30/18   Maczis, Barth Kirks, PA-C  oxyCODONE-acetaminophen (PERCOCET/ROXICET) 5-325 MG tablet Take 1 tablet by mouth every 6 (six) hours as needed for severe pain. 10/05/18   Valarie Merino, MD  pantoprazole (PROTONIX) 40 MG tablet TAKE 1 TABLET (40 MG TOTAL) BY MOUTH DAILY. Patient not taking: Reported on 04/27/2018  12/20/15   Bhagat, Sharrell KuBhavinkumar, PA  traMADol (ULTRAM) 50 MG tablet Take 1 tablet (50 mg total) by mouth every 6 (six) hours as needed. 10/04/18   Hall-Potvin, [place], PA-C    Family History Family History  Problem Relation Age of Onset  . Colon cancer Father   . Pancreatic cancer Mother   . Diabetes Brother   . Diabetes Sister     Social History Social History   Tobacco Use  . Smoking status: Never Smoker  . Smokeless tobacco: Never Used  Substance Use Topics  . Alcohol use: No  . Drug use: No     Allergies   Sulfa antibiotics and Codeine   Review of Systems As per HPI   Physical Exam Triage Vital Signs ED Triage Vitals  Enc Vitals Group     BP 10/04/18 1528 122/76     Pulse Rate 10/04/18 1528 92     Resp --      Temp 10/04/18 1528 98 F (36.7 C)     Temp src  --      SpO2 10/04/18 1528 97 %     Weight 10/04/18 1526 234 lb (106.1 kg)     Height --      Head Circumference --      Peak Flow --      Pain Score 10/04/18 1526 10     Pain Loc --      Pain Edu? --      Excl. in GC? --    No data found.  Updated Vital Signs BP 122/76 (BP Location: Right Arm)   Pulse 92   Temp 98 F (36.7 C)   Wt 234 lb (106.1 kg)   SpO2 97%   BMI 41.45 kg/m   Visual Acuity Right Eye Distance:   Left Eye Distance:   Bilateral Distance:    Right Eye Near:   Left Eye Near:    Bilateral Near:     Physical Exam Constitutional:      General: She is not in acute distress. HENT:     Head: Normocephalic and atraumatic.  Eyes:     General: No scleral icterus.    Pupils: Pupils are equal, round, and reactive to light.  Cardiovascular:     Rate and Rhythm: Normal rate.  Pulmonary:     Effort: Pulmonary effort is normal.  Musculoskeletal:     Comments: Knee with mild edema as compared to left, negative blotting test.  Negative prepatellar and patella tenderness palpation.  Knee joint appears to be stable, though exam is limited second to patient's habitus.  Patient has 5/5 strength of knee and ankles bilaterally and symmetric.  Neurovascularly intact.  No calf tenderness, negative Homans.  Skin:    General: Skin is warm.     Capillary Refill: Capillary refill takes less than 2 seconds.     Coloration: Skin is not jaundiced or pale.  Neurological:     General: No focal deficit present.     Mental Status: She is alert and oriented to person, place, and time.      UC Treatments / Results  Labs (all labs ordered are listed, but only abnormal results are displayed) Labs Reviewed - No data to display  EKG None  Radiology No results found.  Procedures Procedures (including critical care time)  Medications Ordered in UC Medications - No data to display  Initial Impression / Assessment and Plan / UC Course  I have reviewed the triage vital  signs and  the nursing notes.  Pertinent labs & imaging results that were available during my care of the patient were reviewed by me and considered in my medical decision making (see chart for details).     68 year old female with history of bilateral knee pain, hypertension, obesity presenting for worsening pain in right knee.  No inciting event, physical exam reassuring so x-ray deferred at this time.  Will increase pain management short-term, have patient follow-up with PCP.  Patient also to follow-up with Ortho regarding possible TKR.  Return precautions discussed, patient verbalized understanding. Final Clinical Impressions(s) / UC Diagnoses   Final diagnoses:  Arthritis of right knee     Discharge Instructions     Rest, ice, compress, elevate frequently. Follow-up with PCP on Monday. Follow-up with Ortho regarding possible total knee replacement. Return if you develop worsening pain, swelling, redness, inability to bear weight, fever.    ED Prescriptions    Medication Sig Dispense Auth. Provider   traMADol (ULTRAM) 50 MG tablet Take 1 tablet (50 mg total) by mouth every 6 (six) hours as needed. 15 tablet Hall-Potvin, [place], PA-C     Controlled Substance Prescriptions [place] Controlled Substance Registry consulted? Yes, I have consulted the [place] Controlled Substances Registry for this patient, and feel the risk/benefit ratio today is favorable for proceeding with this prescription for a controlled substance.   Hall-Potvin, [place], PA-C 10/04/18 1850    Hall-Potvin, [place]ittany, [place] JerseyPA-C 10/08/18 [phone]

## 2018-10-04 NOTE — ED Triage Notes (Signed)
Pt states she needs a replacement and she was suppose to be having surgery the first of the year. But she state she had to have another surgery around that time. Pt states the pain is well over a 10 on the pain scale.

## 2018-10-04 NOTE — Discharge Instructions (Signed)
Rest, ice, compress, elevate frequently. Follow-up with PCP on Monday. Follow-up with Ortho regarding possible total knee replacement. Return if you develop worsening pain, swelling, redness, inability to bear weight, fever.

## 2018-10-05 ENCOUNTER — Other Ambulatory Visit: Payer: Self-pay

## 2018-10-05 ENCOUNTER — Encounter (HOSPITAL_COMMUNITY): Payer: Self-pay

## 2018-10-05 ENCOUNTER — Emergency Department (HOSPITAL_COMMUNITY): Payer: Medicare HMO

## 2018-10-05 ENCOUNTER — Emergency Department (HOSPITAL_COMMUNITY)
Admission: EM | Admit: 2018-10-05 | Discharge: 2018-10-05 | Disposition: A | Discharge status: Home / Self Care | Payer: Medicare HMO | Attending: Emergency Medicine | Admitting: Emergency Medicine

## 2018-10-05 DIAGNOSIS — M79604 Pain in right leg: Secondary | ICD-10-CM | POA: Diagnosis present

## 2018-10-05 DIAGNOSIS — M25561 Pain in right knee: Secondary | ICD-10-CM

## 2018-10-05 DIAGNOSIS — Z79899 Other long term (current) drug therapy: Secondary | ICD-10-CM | POA: Diagnosis not present

## 2018-10-05 DIAGNOSIS — I10 Essential (primary) hypertension: Secondary | ICD-10-CM | POA: Insufficient documentation

## 2018-10-05 DIAGNOSIS — Z7982 Long term (current) use of aspirin: Secondary | ICD-10-CM | POA: Insufficient documentation

## 2018-10-05 DIAGNOSIS — R52 Pain, unspecified: Secondary | ICD-10-CM

## 2018-10-05 MED ORDER — OXYCODONE-ACETAMINOPHEN 5-325 MG PO TABS: 1.0000 | ORAL_TABLET | Freq: Four times a day (QID) | ORAL | 0 refills | Status: DC | PRN

## 2018-10-05 MED ORDER — OXYCODONE-ACETAMINOPHEN 5-325 MG PO TABS
1.0000 | ORAL_TABLET | Freq: Once | ORAL | Status: AC
  Administered 2018-10-05: 1 via ORAL
  Filled 2018-10-05: qty 1

## 2018-10-05 NOTE — ED Notes (Signed)
Patient transported to X-ray 

## 2018-10-05 NOTE — ED Notes (Signed)
ED Provider at bedside. 

## 2018-10-05 NOTE — ED Notes (Signed)
Patient verbalizes understanding of discharge instructions. Opportunity for questioning and answers were provided. Armband removed by staff, pt discharged from ED. Wheeled out to lobby  

## 2018-10-05 NOTE — ED Notes (Signed)
Pt given discharge instructions, follow up information and the opportunity to ask questions. Pt verbalized understanding. Pt discharged from the Ed without incident.

## 2018-10-05 NOTE — Discharge Instructions (Signed)
Please return for any problem.  Follow-up with your orthopedic doctor as instructed.

## 2018-10-05 NOTE — ED Provider Notes (Signed)
[facility] EMERGENCY DEPARTMENT Provider Note   CSN: [id number] Arrival date & time: 10/05/18  0734     History   Chief Complaint Chief Complaint  Patient presents with  . Leg Pain    HPI Olivia Simpson is a 68 y.o. female.     68 year old female with prior medical history as detailed below presents for evaluation of right knee pain.  Patient reports longstanding history of same.  She has been told that she needs a right knee replacement.  This did not happen secondary to the [place] pandemic.  She now presents complaining of continued persistent pain.  She was seen in urgent care yesterday and was given a prescription for Ultram.  This medication did not help her pain.  She denies recent specific injury.  She denies fever.  She is able to ambulate with difficulty secondary to her pain.  The history is provided by the patient and medical records.  Knee Pain Location:  Knee Knee location:  R knee Pain details:    Quality:  Aching   Radiates to:  Back   Severity:  Moderate   Onset quality:  Gradual   Duration:  6 months   Timing:  Constant   Progression:  Waxing and waning Chronicity:  New Relieved by:  Nothing Worsened by:  Bearing weight   Past Medical History:  Diagnosis Date  . Hypertension   . Snoring   . Vitamin D deficiency     Patient Active Problem List   Diagnosis Date Noted  . Acute cholecystitis 04/28/2018  . Cholecystitis 04/27/2018  . Hypertension   . Vitamin D deficiency   . Snoring     Past Surgical History:  Procedure Laterality Date  . BREAST EXCISIONAL BIOPSY Right   . BREAST LUMPECTOMY  1971  . BUNIONECTOMY Right 2005  . CHOLECYSTECTOMY N/A 04/28/2018   Procedure: LAPAROSCOPIC CHOLECYSTECTOMY WITH INTRAOPERATIVE CHOLANGIOGRAM;  Surgeon: Donnie Mesa, MD;  Location: [facility];  Service: General;  Laterality: N/A;  . hammertoe removal  03/20/13  . ORTHOPEDIC SURGERY Left   . ORTHOPEDIC SURGERY Right   . PARTIAL  HYSTERECTOMY  2002     OB History    Gravida  2   Para  1   Term      Preterm      AB  1   Living  1     SAB  1   TAB      Ectopic      Multiple      Live Births               Home Medications    Prior to Admission medications   Medication Sig Start Date End Date Taking? Authorizing Provider  acetaminophen (TYLENOL) 325 MG tablet Take 2 tablets (650 mg total) by mouth every 6 (six) hours as needed for mild pain (or temp > 100). 04/30/18   Maczis, Barth Kirks, PA-C  aspirin 81 MG tablet Take 81 mg by mouth daily.    [provider]  ibuprofen (ADVIL,MOTRIN) 600 MG tablet Take 600 mg by mouth every 6 (six) hours as needed for headache or mild pain.    [provider]  lisinopril-hydrochlorothiazide (PRINZIDE,ZESTORETIC) 20-12.5 MG per tablet Take 1 tablet by mouth daily.    [provider]  omeprazole (PRILOSEC) 20 MG capsule Take 20 mg by mouth daily.    [provider]  oxyCODONE (OXY IR/ROXICODONE) 5 MG immediate release tablet Take 1 tablet (5 mg  total) by mouth every 6 (six) hours as needed for severe pain. 04/30/18   Maczis, Elmer SowMichael M, PA-C  oxyCODONE-acetaminophen (PERCOCET/ROXICET) 5-325 MG tablet Take 1 tablet by mouth every 6 (six) hours as needed for severe pain. 10/05/18   Wynetta FinesMessick, Joanna Hall C, MD  pantoprazole (PROTONIX) 40 MG tablet TAKE 1 TABLET (40 MG TOTAL) BY MOUTH DAILY. Patient not taking: Reported on 04/27/2018 12/20/15   Manson PasseyBhagat, Bhavinkumar, PA  traMADol (ULTRAM) 50 MG tablet Take 1 tablet (50 mg total) by mouth every 6 (six) hours as needed. 10/04/18   Hall-Potvin, [place], PA-C    Family History Family History  Problem Relation Age of Onset  . Colon cancer Father   . Pancreatic cancer Mother   . Diabetes Brother   . Diabetes Sister     Social History Social History   Tobacco Use  . Smoking status: Never Smoker  . Smokeless tobacco: Never Used  Substance Use Topics  . Alcohol use: No  . Drug use: No      Allergies   Sulfa antibiotics and Codeine   Review of Systems Review of Systems  All other systems reviewed and are negative.    Physical Exam Updated Vital Signs BP 123/77 (BP Location: Right Arm)   Pulse 84   Temp 98 F (36.7 C) (Oral)   Resp 20   Ht 5\' 3"  (1.6 m)   Wt 106.1 kg   SpO2 97%   BMI 41.45 kg/m   Physical Exam Vitals signs and nursing note reviewed.  Constitutional:      General: She is not in acute distress.    Appearance: Normal appearance. She is well-developed.  HENT:     Head: Normocephalic and atraumatic.  Eyes:     Conjunctiva/sclera: Conjunctivae normal.     Pupils: Pupils are equal, round, and reactive to light.  Neck:     Musculoskeletal: Normal range of motion and neck supple.  Cardiovascular:     Rate and Rhythm: Normal rate and regular rhythm.     Heart sounds: Normal heart sounds.  Pulmonary:     Effort: Pulmonary effort is normal. No respiratory distress.     Breath sounds: Normal breath sounds.  Abdominal:     General: There is no distension.     Palpations: Abdomen is soft.     Tenderness: There is no abdominal tenderness.  Musculoskeletal: Normal range of motion.        General: No deformity.  Skin:    General: Skin is warm and dry.  Neurological:     Mental Status: She is alert and oriented to person, place, and time. Mental status is at baseline.      ED Treatments / Results  Labs (all labs ordered are listed, but only abnormal results are displayed) Labs Reviewed - No data to display  EKG    Radiology Dg Pelvis 1-2 Views  Result Date: 10/05/2018 CLINICAL DATA:  Pain from right thigh down to right lower leg. No injury. EXAM: PELVIS - 1-2 VIEW COMPARISON:  CT 04/27/2018 FINDINGS: Minimal symmetric degenerative change of the hips. No evidence of acute fracture or dislocation. No lytic or sclerotic lesions. Degenerative change of the spine. IMPRESSION: No acute findings. Minimal symmetric degenerative change of the hips.  Electronically Signed   By: Elberta Fortisaniel  Boyle M.D.   On: 10/05/2018 08:53   Dg Knee 2 Views Right  Result Date: 10/05/2018 CLINICAL DATA:  Right knee arthritis with pain. EXAM: RIGHT KNEE - 1-2 VIEW COMPARISON:  None. FINDINGS:  Moderate tricompartmental osteoarthritis worse over the patellofemoral joint. No acute fracture or dislocation. No significant joint effusion. IMPRESSION: No acute findings. Moderate osteoarthritic change worse over the patellofemoral joint Electronically Signed   By: Elberta Fortisaniel  Boyle M.D.   On: 10/05/2018 08:50   Dg Femur Min 2 Views Right  Result Date: 10/05/2018 CLINICAL DATA:  Right leg pain from the thigh to the lower leg region. No injury. EXAM: RIGHT FEMUR 2 VIEWS COMPARISON:  None. FINDINGS: Very minimal degenerative change of the right hip. Moderate degenerative changes of the right knee. No acute fracture or dislocation. No lytic or sclerotic lesions. IMPRESSION: No acute findings. Moderate osteoarthritis of the right knee and very minimal degenerative change of the right hip. Electronically Signed   By: Elberta Fortisaniel  Boyle M.D.   On: 10/05/2018 08:52    Procedures Procedures (including critical care time)  Medications Ordered in ED Medications  oxyCODONE-acetaminophen (PERCOCET/ROXICET) 5-325 MG per tablet 1 tablet (1 tablet Oral Given 10/05/18 [id number])     Initial Impression / Assessment and Plan / ED Course  I have reviewed the triage vital signs and the nursing notes.  Pertinent labs & imaging results that were available during my care of the patient were reviewed by me and considered in my medical decision making (see chart for details).        MDM  Screen complete  Loriel A Pham was evaluated in Emergency Department on 10/05/2018 for the symptoms described in the history of present illness. She was evaluated in the context of the global COVID-19 pandemic, which necessitated consideration that the patient might be at risk for infection with the SARS-CoV-2  virus that causes COVID-19. Institutional protocols and algorithms that pertain to the evaluation of patients at risk for COVID-19 are in a state of rapid change based on information released by regulatory bodies including the CDC and federal and state organizations. These policies and algorithms were followed during the patient's care in the ED.   Patient is presenting with complaint of persistent and chronic right knee pain.  Her pain has been somewhat increased over the last 3 to 4 days.  She denies any specific injury.  Xray Films of the right knee, femur, and hip did not suggest acute injury.  Patient does feel improved following her ED evaluation.  She does understand the need for close follow-up with her regular orthopedic care provider.  Final Clinical Impressions(s) / ED Diagnoses   Final diagnoses:  Acute pain of right knee    ED Discharge Orders         Ordered    oxyCODONE-acetaminophen (PERCOCET/ROXICET) 5-325 MG tablet  Every 6 hours PRN     10/05/18 0945           Wynetta FinesMessick, Quanna Wittke C, MD 10/05/18 [phone]

## 2018-10-05 NOTE — ED Triage Notes (Signed)
Pt arrives POV from home c/o right leg pain; pt reports having arthritis in right knee. Pt states pain starts in right thigh and goes down leg. Pain 10/10

## 2018-12-25 LAB — VITAMIN D 25 HYDROXY (VIT D DEFICIENCY, FRACTURES): Vit D, 25-Hydroxy: 34

## 2018-12-25 LAB — HEPATIC FUNCTION PANEL
ALT: 10 (ref 7–35)
AST: 12 — AB (ref 13–35)
Alkaline Phosphatase: 49 (ref 25–125)
Bilirubin, Total: 0.4

## 2018-12-25 LAB — BASIC METABOLIC PANEL
BUN: 17 (ref 4–21)
Creatinine: 0.9 (ref 0.5–1.1)
Glucose: 97
Potassium: 3.9 (ref 3.4–5.3)
Sodium: 139 (ref 137–147)

## 2018-12-25 LAB — CBC AND DIFFERENTIAL
HCT: 38 (ref 36–46)
Hemoglobin: 12.8 (ref 12.0–16.0)
Platelets: 211 (ref 150–399)

## 2018-12-25 LAB — LIPID PANEL
Cholesterol: 172 (ref 0–200)
Triglycerides: 70 (ref 40–160)

## 2019-01-26 ENCOUNTER — Ambulatory Visit (INDEPENDENT_AMBULATORY_CARE_PROVIDER_SITE_OTHER): Payer: Medicare HMO | Admitting: Bariatrics

## 2019-01-26 ENCOUNTER — Other Ambulatory Visit: Payer: Self-pay

## 2019-01-26 ENCOUNTER — Encounter (INDEPENDENT_AMBULATORY_CARE_PROVIDER_SITE_OTHER): Payer: Self-pay | Admitting: Bariatrics

## 2019-01-26 ENCOUNTER — Encounter: Payer: Self-pay | Admitting: Bariatrics

## 2019-01-26 VITALS — BP 113/72 | HR 65 | Temp 97.8°F | Ht 62.0 in | Wt 237.0 lb

## 2019-01-26 DIAGNOSIS — E559 Vitamin D deficiency, unspecified: Secondary | ICD-10-CM | POA: Diagnosis not present

## 2019-01-26 DIAGNOSIS — Z1331 Encounter for screening for depression: Secondary | ICD-10-CM | POA: Diagnosis not present

## 2019-01-26 DIAGNOSIS — R5383 Other fatigue: Secondary | ICD-10-CM | POA: Diagnosis not present

## 2019-01-26 DIAGNOSIS — I10 Essential (primary) hypertension: Secondary | ICD-10-CM

## 2019-01-26 DIAGNOSIS — Z6841 Body Mass Index (BMI) 40.0 and over, adult: Secondary | ICD-10-CM | POA: Diagnosis not present

## 2019-01-26 DIAGNOSIS — R0602 Shortness of breath: Secondary | ICD-10-CM | POA: Diagnosis not present

## 2019-01-26 DIAGNOSIS — Z0289 Encounter for other administrative examinations: Secondary | ICD-10-CM

## 2019-01-27 ENCOUNTER — Encounter (INDEPENDENT_AMBULATORY_CARE_PROVIDER_SITE_OTHER): Payer: Self-pay

## 2019-01-27 NOTE — Progress Notes (Signed)
Office: [phone]  /  Fax: [phone]   Dear Dr. Eulah Pont,   Thank you for referring Olivia Simpson to our clinic. The following note includes my evaluation and treatment recommendations.  HPI:   Chief Complaint: OBESITY    [name] has been referred by [name], MD for consultation regarding her obesity and obesity related comorbidities.    Olivia Simpson (MR# [id number]) is a 68 y.o. female who presents on 01/26/2019 for obesity evaluation and treatment. Current BMI is Body mass index is 43.35 kg/m.Olivia Simpson has been struggling with her weight for many years and has been unsuccessful in either losing weight, maintaining weight loss, or reaching her healthy weight goal. Olivia Simpson needs to lose weight to have knee (right) replacement surgery.     Olivia Simpson attended our information session and states she is currently in the action stage of change and ready to dedicate time achieving and maintaining a healthier weight. Olivia Simpson is interested in becoming our patient and working on intensive lifestyle modifications including (but not limited to) diet, exercise and weight loss.    Olivia Simpson states her family eats meals together she thinks her family will eat healthier with  her her desired weight loss is 57 lbs. she started gaining weight after hysterectomy and gallbladder surgery her heaviest weight ever was 240 lbs. she states that she is a "picky eater"  she has significant food cravings issues  she snacks frequently in the evenings she skips meals frequently she is frequently drinking liquids with calories she sometimes makes poor food choices she sometimes eats larger portions than normal  she has binge eating behaviors she struggles with emotional eating    Fatigue Olivia Simpson feels her energy is lower than it should be. This has worsened with weight gain and has not worsened recently. Olivia Simpson admits to daytime somnolence and she admits to waking up still tired. Patient  is at risk for obstructive sleep apnea. Olivia Simpson has a history of symptoms of daytime fatigue, morning fatigue, morning headache and hypertension. Patient generally gets 8 or more hours of sleep per night, and states they generally have restful sleep. Snoring is present. Apneic episodes are present. Epworth Sleepiness Score is 1  Dyspnea on exertion Clariece notes increasing shortness of breath with certain activities and seems to be worsening over time with weight gain. She notes getting out of breath sooner with activity than she used to. This has not gotten worse recently. Gayleen denies orthopnea.  Hypertension [name] is a 68 y.o. female with hypertension. She is taking Prinzide.  [name] denies chest pain. She is working weight loss to help control her blood pressure with the goal of decreasing her risk of heart attack and stroke. [name] blood pressure is well controlled.  Vitamin D deficiency [name] has a diagnosis of vitamin D deficiency. Olivia Simpson is currently taking OTC vit D 1,000 IU daily and she denies nausea, vomiting or muscle weakness.  Depression Screen Olivia Simpson's Food and Mood (modified PHQ-9) score was  Depression screen PHQ 2/9 01/26/2019  Decreased Interest 3  Down, Depressed, Hopeless 2  PHQ - 2 Score 5  Altered sleeping 3  Tired, decreased energy 3  Change in appetite 1  Feeling bad or failure about yourself  2  Trouble concentrating 2  Moving slowly or fidgety/restless 2  Suicidal thoughts 0  PHQ-9 Score 18  Difficult doing work/chores Somewhat difficult    ASSESSMENT AND PLAN:  Other fatigue - Plan: EKG 12-Lead  SOB (shortness of  breath) on exertion  Essential hypertension  Vitamin D deficiency  Depression screening  Class 3 severe obesity with serious comorbidity and body mass index (BMI) of 40.0 to 44.9 in adult, unspecified obesity type (HCC)  PLAN:  Fatigue Olivia Simpson was informed that her fatigue may be related to obesity,  depression or many other causes. Labs will be ordered, and in the meanwhile Olivia Simpson has agreed to work on diet, exercise and weight loss to help with fatigue. Proper sleep hygiene was discussed including the need for 7-8 hours of quality sleep each night. A sleep study was not ordered based on symptoms and Epworth score.  Dyspnea on exertion Olivia Simpson's shortness of breath appears to be obesity related and exercise induced. She has agreed to work on weight loss and gradually increase activity to treat her exercise induced shortness of breath. If Olivia Simpson follows our instructions and loses weight without improvement of her shortness of breath, we will plan to refer to pulmonology. We will monitor this condition regularly. Olivia Simpson agrees to this plan.  Hypertension We discussed sodium restriction, working on healthy weight loss, and a regular exercise program as the means to achieve improved blood pressure control. Olivia Simpson agreed with this plan and agreed to follow up as directed. We will continue to monitor her blood pressure as well as her progress with the above lifestyle modifications. She will continue her medications as prescribed and will watch for signs of hypotension as she continues her lifestyle modifications.  Vitamin D Deficiency Olivia Simpson was informed that low vitamin D levels contributes to fatigue and are associated with obesity, breast, and colon cancer. Olivia Simpson will continue to take OTC Vit D @1 ,000 IU daily and she will follow up for routine testing of vitamin D, at least 2-3 times per year. She was informed of the risk of over-replacement of vitamin D and agrees to not increase her dose unless she discusses this with [name] first. We will check vitamin D level and Olivia Simpson will follow up as directed.  Depression Screen Olivia Simpson had a strongly positive depression screening. Depression is commonly associated with obesity and often results in emotional eating behaviors. We will monitor this closely  and work on CBT to help improve the non-hunger eating patterns. Referral to Psychology may be required if no improvement is seen as she continues in our clinic.  Obesity Olivia Simpson is currently in the action stage of change and her goal is to continue with weight loss efforts. I recommend Cilicia begin the structured treatment plan as follows:  She has agreed to follow the category [place] has been instructed to eventually work up to a goal of 150 minutes of combined cardio and strengthening exercise per week for weight loss and overall health benefits. We discussed the following Behavioral Modification Strategies today: planning for success, increase H2O intake, no skipping meals, keeping healthy foods in the home, increasing lean protein intake, decreasing simple carbohydrates, increasing vegetables, decrease eating out and work on meal planning and intentional eating Florence will start to read nutrition labels and weigh her meat.   She was informed of the importance of frequent follow up visits to maximize her success with intensive lifestyle modifications for her multiple health conditions. She was informed we would discuss her lab results at her next visit unless there is a critical issue that needs to be addressed sooner. Tyshea agreed to keep her next visit at the agreed upon time to discuss these results.  ALLERGIES: Allergies  Allergen Reactions  . Sulfa  Antibiotics Swelling    SWELLING REACTION UNSPECIFIED   . Codeine Nausea And Vomiting    MEDICATIONS: Current Outpatient Medications on File Prior to Visit  Medication Sig Dispense Refill  . aspirin 81 MG tablet Take 81 mg by mouth daily.    . cholecalciferol (VITAMIN D3) 25 MCG (1000 UT) tablet Take 1,000 Units by mouth daily.    Olivia Kitchen ibuprofen (ADVIL,MOTRIN) 600 MG tablet Take 600 mg by mouth every 6 (six) hours as needed for headache or mild pain.    . Melatonin 5 MG CAPS Take by mouth daily.    . Multiple Vitamin  (MULTIVITAMIN) capsule Take 1 capsule by mouth daily.    Olivia Kitchen omeprazole (PRILOSEC) 20 MG capsule Take 20 mg by mouth daily.     No current facility-administered medications on file prior to visit.     PAST MEDICAL HISTORY: Past Medical History:  Diagnosis Date  . Arthritis of knee   . Back pain   . Edema, lower extremity   . Gallbladder problem   . Hypertension   . IBS (irritable bowel syndrome)   . Joint pain   . Lumbar stenosis   . Snoring   . Vitamin D deficiency   . Vitamin D deficiency     PAST SURGICAL HISTORY: Past Surgical History:  Procedure Laterality Date  . BREAST EXCISIONAL BIOPSY Right   . BREAST LUMPECTOMY  1971  . BUNIONECTOMY Right 2005  . CHOLECYSTECTOMY N/A 04/28/2018   Procedure: LAPAROSCOPIC CHOLECYSTECTOMY WITH INTRAOPERATIVE CHOLANGIOGRAM;  Surgeon: Manus Rudd, MD;  Location: [facility] OR;  Service: General;  Laterality: N/A;  . hammertoe removal  03/20/13  . ORTHOPEDIC SURGERY Left   . ORTHOPEDIC SURGERY Right   . PARTIAL HYSTERECTOMY  2002    SOCIAL HISTORY: Social History   Tobacco Use  . Smoking status: Never Smoker  . Smokeless tobacco: Never Used  Substance Use Topics  . Alcohol use: No  . Drug use: No    FAMILY HISTORY: Family History  Problem Relation Age of Onset  . Colon cancer Father   . High blood pressure Father   . Pancreatic cancer Mother   . Diabetes Mother   . High blood pressure Mother   . Diabetes Brother   . Diabetes Sister     ROS: Review of Systems  Constitutional: Positive for malaise/fatigue.  HENT: Positive for congestion (nasal stuffiness) and nosebleeds.        Positive for Dentures  Eyes:       Positive for Wear Glasses or Contacts  Respiratory: Positive for shortness of breath (on exertion).   Cardiovascular: Negative for orthopnea.       Positive for Calf/Leg Pain with Walking  Gastrointestinal: Positive for constipation. Negative for nausea and vomiting.  Musculoskeletal: Positive for back pain.        Positive for Muscle or Joint Pain Positive for Muscle Stiffness  Neurological: Positive for headaches.  Psychiatric/Behavioral: The patient has insomnia.     PHYSICAL EXAM: Blood pressure 113/72, pulse 65, temperature 97.8 F (36.6 C), temperature source Oral, height  (1.575 m), weight 237 lb (107.5 kg), SpO2 99 %. Body mass index is 43.35 kg/m. Physical Exam Vitals signs reviewed.  Constitutional:      Appearance: Normal appearance. She is well-developed. She is obese.  HENT:     Head: Normocephalic and atraumatic.     Nose: Nose normal.  Eyes:     General: No scleral icterus.    Extraocular Movements: Extraocular movements intact.  Neck:  Musculoskeletal: Normal range of motion and neck supple.     Thyroid: No thyromegaly.  Cardiovascular:     Rate and Rhythm: Normal rate and regular rhythm.  Pulmonary:     Effort: Pulmonary effort is normal. No respiratory distress.  Abdominal:     Palpations: Abdomen is soft.     Tenderness: There is no abdominal tenderness.  Musculoskeletal: Normal range of motion.     Comments: Range of Motion normal in all 4 extremities  Skin:    General: Skin is warm and dry.  Neurological:     Mental Status: She is alert and oriented to person, place, and time.     Coordination: Coordination normal.  Psychiatric:        Mood and Affect: Mood normal.        Behavior: Behavior normal.     RECENT LABS AND TESTS: BMET    Component Value Date/Time   NA 142 04/30/2018 0310   K 3.5 04/30/2018 0310   CL 109 04/30/2018 0310   CO2 23 04/30/2018 0310   GLUCOSE 100 (H) 04/30/2018 0310   BUN 12 04/30/2018 0310   CREATININE 0.88 04/30/2018 0310   CREATININE 0.77 01/31/2012 1014   CALCIUM 7.9 (L) 04/30/2018 0310   GFRNONAA >60 04/30/2018 0310   GFRAA >60 04/30/2018 0310   No results found for: HGBA1C No results found for: INSULIN CBC    Component Value Date/Time   WBC 10.7 (H) 04/30/2018 0310   RBC 3.47 (L) 04/30/2018 0310   HGB  9.9 (L) 04/30/2018 0310   HCT 31.0 (L) 04/30/2018 0310   PLT 144 (L) 04/30/2018 0310   MCV 89.3 04/30/2018 0310   MCH 28.5 04/30/2018 0310   MCHC 31.9 04/30/2018 0310   RDW 15.3 04/30/2018 0310   LYMPHSABS 1.5 04/27/2018 0845   MONOABS 0.4 04/27/2018 0845   EOSABS 0.0 04/27/2018 0845   BASOSABS 0.0 04/27/2018 0845   Iron/TIBC/Ferritin/ %Sat No results found for: IRON, TIBC, FERRITIN, IRONPCTSAT Lipid Panel  No results found for: CHOL, TRIG, HDL, CHOLHDL, VLDL, LDLCALC, LDLDIRECT Hepatic Function Panel     Component Value Date/Time   PROT 5.6 (L) 04/30/2018 0310   ALBUMIN 2.7 (L) 04/30/2018 0310   AST 28 04/30/2018 0310   ALT 31 04/30/2018 0310   ALKPHOS 33 (L) 04/30/2018 0310   BILITOT 0.7 04/30/2018 0310      Component Value Date/Time   TSH 1.422 01/31/2012 1014    ECG  shows NSR with a rate of 68 BPM INDIRECT CALORIMETER done today shows a VO2 of 331 and a REE of 2306.  Her calculated basal metabolic rate is [id number] thus her basal metabolic rate is better than expected.      OBESITY BEHAVIORAL INTERVENTION VISIT  Today's visit was # 1   Starting weight: 237 lbs Starting date: 01/26/2019 Today's weight : 237 lbs Today's date: 01/26/2019 Total lbs lost to date: 0    01/26/2019  Height 5\' 2"  (1.575 m)  Weight 237 lb (107.5 kg)  BMI (Calculated) 43.34  BLOOD PRESSURE - SYSTOLIC 113  BLOOD PRESSURE - DIASTOLIC 72  Waist Measurement  47 inches   Body Fat % 53.7 %  RMR 2306    ASK: We discussed the diagnosis of obesity with Kyesha A Rozak today and Nicolemarie agreed to give us permission to discuss obesity behavioral modification therapy today.  ASSESS: Park MeoCeleste has the diagnosis of obesity and her BMI today is 43.34 Melysa is in the action stage of change  ADVISE: Akela was educated on the multiple health risks of obesity as well as the benefit of weight loss to improve her health. She was advised of the need for long term treatment and the importance of  lifestyle modifications to improve her current health and to decrease her risk of future health problems.  AGREE: Multiple dietary modification options and treatment options were discussed and  Anieya agreed to follow the recommendations documented in the above note.  ARRANGE: Ailana was educated on the importance of frequent visits to treat obesity as outlined per CMS and USPSTF guidelines and agreed to schedule her next follow up appointment today.  Cristi Loron, am acting as Energy manager for [employer]. Manson Passey, DO   I have reviewed the above documentation for accuracy and completeness, and I agree with the above. -Corinna Capra, DO

## 2019-01-28 ENCOUNTER — Encounter (INDEPENDENT_AMBULATORY_CARE_PROVIDER_SITE_OTHER): Payer: Self-pay | Admitting: Bariatrics

## 2019-02-09 ENCOUNTER — Other Ambulatory Visit: Payer: Self-pay

## 2019-02-09 ENCOUNTER — Encounter (INDEPENDENT_AMBULATORY_CARE_PROVIDER_SITE_OTHER): Payer: Self-pay | Admitting: Bariatrics

## 2019-02-09 ENCOUNTER — Ambulatory Visit (INDEPENDENT_AMBULATORY_CARE_PROVIDER_SITE_OTHER): Payer: Medicare HMO | Admitting: Bariatrics

## 2019-02-09 VITALS — BP 104/75 | HR 70 | Temp 98.6°F | Ht 62.0 in | Wt 234.0 lb

## 2019-02-09 DIAGNOSIS — E559 Vitamin D deficiency, unspecified: Secondary | ICD-10-CM

## 2019-02-09 DIAGNOSIS — Z6841 Body Mass Index (BMI) 40.0 and over, adult: Secondary | ICD-10-CM

## 2019-02-09 DIAGNOSIS — I10 Essential (primary) hypertension: Secondary | ICD-10-CM | POA: Diagnosis not present

## 2019-02-09 MED ORDER — VITAMIN D (ERGOCALCIFEROL) 1.25 MG (50000 UNIT) PO CAPS: 50000.0000 [IU] | ORAL_CAPSULE | ORAL | 0 refills | Status: DC

## 2019-02-10 ENCOUNTER — Encounter (INDEPENDENT_AMBULATORY_CARE_PROVIDER_SITE_OTHER): Payer: Self-pay | Admitting: Bariatrics

## 2019-02-10 NOTE — Progress Notes (Signed)
Office: [phone]  /  Fax: [phone]   HPI:   Chief Complaint: OBESITY Olivia Simpson is here to discuss her progress with her obesity treatment plan. She is on the Category 4 plan and is following her eating plan approximately 80% of the time. She states she is exercising 0 minutes 0 times per week. Olivia Simpson is down 3 lbs. She struggled with eating cottage cheese and has increased her water, but she is still not getting enough. Her weight is 234 lb (106.1 kg) today and has had a weight loss of 3 pounds over a period of 2 weeks since her last visit. She has lost 3 lbs since starting treatment with [name].  Vitamin D deficiency Olivia Simpson has a diagnosis of Vitamin D deficiency. Last Vitamin D 34 on 12/25/2018. She is currently taking OTC Vit D and denies nausea, vomiting or muscle weakness.  Hypertension Olivia Simpson is a 68 y.o. female with hypertension and is on no medication. Olivia Simpson denies chest pain or shortness of breath on exertion. She is working weight loss to help control her blood pressure with the goal of decreasing her risk of heart attack and stroke. Olivia Simpson's blood pressure is well controlled.   ASSESSMENT AND PLAN:  Vitamin D deficiency - Plan: Vitamin D, Ergocalciferol, (DRISDOL) 1.25 MG (50000 UT) CAPS capsule  Essential hypertension  Class 3 severe obesity with serious comorbidity and body mass index (BMI) of 40.0 to 44.9 in adult, unspecified obesity type ([place])  PLAN:  Vitamin D Deficiency Olivia Simpson was informed that low Vitamin D levels contributes to fatigue and are associated with obesity, breast, and colon cancer. She agrees to start taking prescription Vit D @ 50,000 IU every week #4 with 0 refills and will follow-up for routine testing of Vitamin D, at least 2-3 times per year. She was informed of the risk of over-replacement of Vitamin D and agrees to not increase her dose unless she discusses this with [name] first. Olivia Simpson agrees to follow-up with our clinic  in 2 weeks.  Hypertension We discussed sodium restriction, working on healthy weight loss, and a regular exercise program as the means to achieve improved blood pressure control. Olivia Simpson agreed with this plan and agreed to follow up as directed. We will continue to monitor her blood pressure as well as her progress with the above lifestyle modifications. She was instructed not to add salt to her diet, increase activities/exercise, and we will continue to monitor. She will watch for signs of hypotension as she continues her lifestyle modifications.  Obesity Olivia Simpson is currently in the action stage of change. As such, her goal is to continue with weight loss efforts. She has agreed to follow the Category 4 plan with additional lunch options. Olivia Simpson will increase her water intake, work on meal planning, and will weigh herself once a week. She was given sheet on Microwave Meals with 200-300 calories and 20 grams of protein. Olivia Simpson has been instructed to work up to a goal of 150 minutes of combined cardio and strengthening exercise per week for weight loss and overall health benefits. We discussed the following Behavioral Modification Strategies today: increasing lean protein intake, decreasing simple carbohydrates, increasing vegetables, increase H20 intake, decreasing sodium intake, decrease eating out, no skipping meals, work on meal planning and easy cooking plans, and avoiding temptations.  Olivia Simpson has agreed to follow-up with our clinic in 2 weeks. She was informed of the importance of frequent follow-up visits to maximize her success with intensive lifestyle modifications for  her multiple health conditions.  ALLERGIES: Allergies  Allergen Reactions   Sulfa Antibiotics Swelling    SWELLING REACTION UNSPECIFIED    Codeine Nausea And Vomiting    MEDICATIONS: Current Outpatient Medications on File Prior to Visit  Medication Sig Dispense Refill   aspirin 81 MG tablet Take 81 mg by mouth  daily.     cholecalciferol (VITAMIN D3) 25 MCG (1000 UT) tablet Take 1,000 Units by mouth daily.     ibuprofen (ADVIL,MOTRIN) 600 MG tablet Take 600 mg by mouth every 6 (six) hours as needed for headache or mild pain.     Melatonin 5 MG CAPS Take by mouth daily.     Multiple Vitamin (MULTIVITAMIN) capsule Take 1 capsule by mouth daily.     omeprazole (PRILOSEC) 20 MG capsule Take 20 mg by mouth daily.     No current facility-administered medications on file prior to visit.     PAST MEDICAL HISTORY: Past Medical History:  Diagnosis Date   Arthritis of knee    Back pain    Edema, lower extremity    Gallbladder problem    Hypertension    IBS (irritable bowel syndrome)    Joint pain    Lumbar stenosis    Snoring    Vitamin D deficiency    Vitamin D deficiency     PAST SURGICAL HISTORY: Past Surgical History:  Procedure Laterality Date   BREAST EXCISIONAL BIOPSY Right    BREAST LUMPECTOMY  1971   BUNIONECTOMY Right 2005   CHOLECYSTECTOMY N/A 04/28/2018   Procedure: LAPAROSCOPIC CHOLECYSTECTOMY WITH INTRAOPERATIVE CHOLANGIOGRAM;  Surgeon: Manus Ruddsuei, Matthew, MD;  Location: [facility] OR;  Service: General;  Laterality: N/A;   hammertoe removal  03/20/13   ORTHOPEDIC SURGERY Left    ORTHOPEDIC SURGERY Right    PARTIAL HYSTERECTOMY  2002    SOCIAL HISTORY: Social History   Tobacco Use   Smoking status: Never Smoker   Smokeless tobacco: Never Used  Substance Use Topics   Alcohol use: No   Drug use: No    FAMILY HISTORY: Family History  Problem Relation Age of Onset   Colon cancer Father    High blood pressure Father    Pancreatic cancer Mother    Diabetes Mother    High blood pressure Mother    Diabetes Brother    Diabetes Sister    ROS: Review of Systems  Respiratory: Negative for shortness of breath.   Cardiovascular: Negative for chest pain.  Gastrointestinal: Negative for nausea and vomiting.  Musculoskeletal:       Negative for  muscle weakness.   PHYSICAL EXAM: Blood pressure 104/75, pulse 70, temperature 98.6 F (37 C), temperature source Oral, height 5\' 2"  (1.575 m), weight 234 lb (106.1 kg), SpO2 99 %. Body mass index is 42.8 kg/m. Physical Exam Vitals signs reviewed.  Constitutional:      Appearance: Normal appearance. She is obese.  Cardiovascular:     Rate and Rhythm: Normal rate.     Pulses: Normal pulses.  Pulmonary:     Effort: Pulmonary effort is normal.     Breath sounds: Normal breath sounds.  Musculoskeletal: Normal range of motion.  Skin:    General: Skin is warm and dry.  Neurological:     Mental Status: She is alert and oriented to person, place, and time.  Psychiatric:        Behavior: Behavior normal.   RECENT LABS AND TESTS: BMET    Component Value Date/Time   NA 139 12/25/2018  K 3.9 12/25/2018   CL 109 04/30/2018 0310   CO2 23 04/30/2018 0310   GLUCOSE 100 (H) 04/30/2018 0310   BUN 17 12/25/2018   CREATININE 0.9 12/25/2018   CREATININE 0.88 04/30/2018 0310   CREATININE 0.77 01/31/2012 1014   CALCIUM 7.9 (L) 04/30/2018 0310   GFRNONAA >60 04/30/2018 0310   GFRAA >60 04/30/2018 0310   No results found for: HGBA1C No results found for: INSULIN CBC    Component Value Date/Time   WBC 10.7 (H) 04/30/2018 0310   RBC 3.47 (L) 04/30/2018 0310   HGB 12.8 12/25/2018   HCT 38 12/25/2018   PLT 211 12/25/2018   MCV 89.3 04/30/2018 0310   MCH 28.5 04/30/2018 0310   MCHC 31.9 04/30/2018 0310   RDW 15.3 04/30/2018 0310   LYMPHSABS 1.5 04/27/2018 0845   MONOABS 0.4 04/27/2018 0845   EOSABS 0.0 04/27/2018 0845   BASOSABS 0.0 04/27/2018 0845   Iron/TIBC/Ferritin/ %Sat No results found for: IRON, TIBC, FERRITIN, IRONPCTSAT Lipid Panel     Component Value Date/Time   CHOL 172 12/25/2018   TRIG 70 12/25/2018   Hepatic Function Panel     Component Value Date/Time   PROT 5.6 (L) 04/30/2018 0310   ALBUMIN 2.7 (L) 04/30/2018 0310   AST 12 (A) 12/25/2018   ALT 10  12/25/2018   ALKPHOS 49 12/25/2018   BILITOT 0.7 04/30/2018 0310      Component Value Date/Time   TSH 1.422 01/31/2012 1014   Results for [place], [place] A (MRN [id number]) as of 02/10/2019 07:00  Ref. Range 12/25/2018 00:00  Vitamin D, 25-Hydroxy Unknown 34   OBESITY BEHAVIORAL INTERVENTION VISIT  Today's visit was #2  Starting weight: 237 lbs Starting date: 01/26/2019 Today's weight: 234 lbs  Today's date: 02/09/2019 Total lbs lost to date: 3 At least 15 minutes were spent on discussing the following behavioral intervention visit.    02/09/2019  Height 5\' 2"  (1.575 m)  Weight 234 lb (106.1 kg)  BMI (Calculated) 42.79  BLOOD PRESSURE - SYSTOLIC 104  BLOOD PRESSURE - DIASTOLIC 75   Body Fat % 52.6 %  Total Body Water (lbs) 85.6 lbs   ASK: We discussed the diagnosis of obesity with Olivia Simpson today and Olivia Simpson agreed to give permission to discuss obesity behavioral modification therapy today.  ASSESS: Jalene has the diagnosis of obesity and her BMI today is 42.8. Vendela is in the action stage of change.   ADVISE: Olivia Simpson was educated on the multiple health risks of obesity as well as the benefit of weight loss to improve her health. She was advised of the need for long term treatment and the importance of lifestyle modifications to improve her current health and to decrease her risk of future health problems.  AGREE: Multiple dietary modification options and treatment options were discussed and  Olivia Simpson agreed to follow the recommendations documented in the above note.  ARRANGE: Olivia Simpson was educated on the importance of frequent visits to treat obesity as outlined per CMS and USPSTF guidelines and agreed to schedule her next follow up appointment today.  Olivia Simpson, am acting as Olivia Simpson for Simpson manager, DO  I have reviewed the above documentation for accuracy and completeness, and I agree with the above. -[employer], DO

## 2019-03-03 ENCOUNTER — Ambulatory Visit (INDEPENDENT_AMBULATORY_CARE_PROVIDER_SITE_OTHER): Payer: Medicare HMO | Admitting: Bariatrics

## 2019-03-03 ENCOUNTER — Encounter (INDEPENDENT_AMBULATORY_CARE_PROVIDER_SITE_OTHER): Payer: Self-pay | Admitting: Bariatrics

## 2019-03-03 ENCOUNTER — Other Ambulatory Visit: Payer: Self-pay

## 2019-03-03 VITALS — BP 101/68 | HR 67 | Temp 98.1°F | Ht 62.0 in | Wt 231.0 lb

## 2019-03-03 DIAGNOSIS — K219 Gastro-esophageal reflux disease without esophagitis: Secondary | ICD-10-CM

## 2019-03-03 DIAGNOSIS — I10 Essential (primary) hypertension: Secondary | ICD-10-CM

## 2019-03-03 DIAGNOSIS — Z6841 Body Mass Index (BMI) 40.0 and over, adult: Secondary | ICD-10-CM

## 2019-03-03 DIAGNOSIS — R7309 Other abnormal glucose: Secondary | ICD-10-CM

## 2019-03-03 NOTE — Progress Notes (Signed)
Office: [phone]  /  Fax: [phone]   HPI:   Chief Complaint: OBESITY Olivia Simpson is here to discuss her progress with her obesity treatment plan. She is on the Category 4 plan with additional lunch options and is following her eating plan approximately 85-90% of the time. She states she is exercising 0 minutes 0 times per week. Olivia Simpson is down 3 lbs and states that she is losing inches. Her weight is 231 lb (104.8 kg) today and has had Simpson weight loss of 3 pounds over Simpson period of 3 weeks since her last visit. She has lost 6 lbs since starting treatment with [name].  Gastroesophageal Reflux Disease (GERD) Olivia Simpson has Simpson diagnosis of gastroesophageal reflux disease and is taking Prilosec.  Hypertension Olivia Simpson is Simpson 68 y.o. female with hypertension, which is well controlled.  Olivia Simpson denies chest pain or shortness of breath on exertion. She is working weight loss to help control her blood pressure with the goal of decreasing her risk of heart attack and stroke. Olivia Simpson's blood pressure is 101/68 today.  Elevated Glucose Allan has elevated glucose. She denies polyphagia.  ASSESSMENT AND PLAN:  Gastroesophageal reflux disease, unspecified whether esophagitis present  Essential hypertension  Elevated glucose - Plan: HgB A1c, Insulin, random, Vitamin D (25 hydroxy), Comprehensive Metabolic Panel (CMET)  Class 3 severe obesity with serious comorbidity and body mass index (BMI) of 40.0 to 44.9 in adult, unspecified obesity type (HCC)  PLAN:  Gastroesophageal Reflux Disease (GERD) Olivia Simpson will continue her medications. She will avoid food and drink triggers.  Hypertension We discussed sodium restriction, working on healthy weight loss, and Simpson regular exercise program as the means to achieve improved blood pressure control. Olivia Simpson agreed with this plan and agreed to follow up as directed. We will continue to monitor her blood pressure as well as her progress with the  above lifestyle modifications. She will continue her medications as prescribed and will watch for signs of hypotension as she continues her lifestyle modifications.  Elevated Glucose Olivia Simpson will have A1c and insulin levels checked. She will follow-up in 2-3 weeks for review of lab results.  Obesity Olivia Simpson is currently in the action stage of change. As such, her goal is to continue with weight loss efforts. She has agreed to follow the Category 4 plan with additional lunch options. Olivia Simpson will work on meal planning. We discussed protein alternatives. Olivia Simpson has been instructed to move more at home for weight loss and overall health benefits. We discussed the following Behavioral Modification Strategies today: increasing lean protein intake, decreasing simple carbohydrates, increasing vegetables, increase H20 intake, decrease eating out, no skipping meals, work on meal planning and easy cooking plans, and keeping healthy foods in the home.  Olivia Simpson has agreed to follow-up with our clinic in 2-3 weeks. She was informed of the importance of frequent follow-up visits to maximize her success with intensive lifestyle modifications for her multiple health conditions.  ALLERGIES: Allergies  Allergen Reactions  . Sulfa Antibiotics Swelling    SWELLING REACTION UNSPECIFIED   . Codeine Nausea And Vomiting    MEDICATIONS: Current Outpatient Medications on File Prior to Visit  Medication Sig Dispense Refill  . aspirin 81 MG tablet Take 81 mg by mouth daily.    . cholecalciferol (VITAMIN D3) 25 MCG (1000 UT) tablet Take 1,000 Units by mouth daily.    [name] ibuprofen (ADVIL,MOTRIN) 600 MG tablet Take 600 mg by mouth every 6 (six) hours as needed for headache or mild pain.    [name]  Melatonin 5 MG CAPS Take by mouth daily.    . Multiple Vitamin (MULTIVITAMIN) capsule Take 1 capsule by mouth daily.    [name] omeprazole (PRILOSEC) 20 MG capsule Take 20 mg by mouth daily.    . Vitamin D, Ergocalciferol, (DRISDOL)  1.25 MG (50000 UT) CAPS capsule Take 1 capsule (50,000 Units total) by mouth every 7 (seven) days. 4 capsule 0   No current facility-administered medications on file prior to visit.     PAST MEDICAL HISTORY: Past Medical History:  Diagnosis Date  . Arthritis of knee   . Back pain   . Edema, lower extremity   . Gallbladder problem   . Hypertension   . IBS (irritable bowel syndrome)   . Joint pain   . Lumbar stenosis   . Snoring   . Vitamin D deficiency   . Vitamin D deficiency     PAST SURGICAL HISTORY: Past Surgical History:  Procedure Laterality Date  . BREAST EXCISIONAL BIOPSY Right   . BREAST LUMPECTOMY  1971  . BUNIONECTOMY Right 2005  . CHOLECYSTECTOMY N/Simpson 04/28/2018   Procedure: LAPAROSCOPIC CHOLECYSTECTOMY WITH INTRAOPERATIVE CHOLANGIOGRAM;  Surgeon: Donnie Mesa, MD;  Location: [facility];  Service: General;  Laterality: N/Simpson;  . hammertoe removal  03/20/13  . ORTHOPEDIC SURGERY Left   . ORTHOPEDIC SURGERY Right   . PARTIAL HYSTERECTOMY  2002    SOCIAL HISTORY: Social History   Tobacco Use  . Smoking status: Never Smoker  . Smokeless tobacco: Never Used  Substance Use Topics  . Alcohol use: No  . Drug use: No    FAMILY HISTORY: Family History  Problem Relation Age of Onset  . Colon cancer Father   . High blood pressure Father   . Pancreatic cancer Mother   . Diabetes Mother   . High blood pressure Mother   . Diabetes Brother   . Diabetes Sister    ROS: Review of Systems  Respiratory: Negative for shortness of breath.   Cardiovascular: Negative for chest pain.  Gastrointestinal:       Positive for GERD.  Endo/Heme/Allergies:       Negative for polyphagia.   PHYSICAL EXAM: Blood pressure 101/68, pulse 67, temperature 98.1 F (36.7 C), height 5\' 2"  (1.575 m), weight 231 lb (104.8 kg), SpO2 100 %. Body mass index is 42.25 kg/m. Physical Exam Vitals signs reviewed.  Constitutional:      Appearance: Normal appearance. She is obese.   Cardiovascular:     Rate and Rhythm: Normal rate.     Pulses: Normal pulses.  Pulmonary:     Effort: Pulmonary effort is normal.     Breath sounds: Normal breath sounds.  Musculoskeletal: Normal range of motion.  Skin:    General: Skin is warm and dry.  Neurological:     Mental Status: She is alert and oriented to person, place, and time.  Psychiatric:        Behavior: Behavior normal.   RECENT LABS AND TESTS: BMET    Component Value Date/Time   NA 139 12/25/2018   K 3.9 12/25/2018   CL 109 04/30/2018 0310   CO2 23 04/30/2018 0310   GLUCOSE 100 (H) 04/30/2018 0310   BUN 17 12/25/2018   CREATININE 0.9 12/25/2018   CREATININE 0.88 04/30/2018 0310   CREATININE 0.77 01/31/2012 1014   CALCIUM 7.9 (L) 04/30/2018 0310   GFRNONAA >60 04/30/2018 0310   GFRAA >60 04/30/2018 0310   No results found for: HGBA1C No results found for: INSULIN CBC  Component Value Date/Time   WBC 10.7 (H) 04/30/2018 0310   RBC 3.47 (L) 04/30/2018 0310   HGB 12.8 12/25/2018   HCT 38 12/25/2018   PLT 211 12/25/2018   MCV 89.3 04/30/2018 0310   MCH 28.5 04/30/2018 0310   MCHC 31.9 04/30/2018 0310   RDW 15.3 04/30/2018 0310   LYMPHSABS 1.5 04/27/2018 0845   MONOABS 0.4 04/27/2018 0845   EOSABS 0.0 04/27/2018 0845   BASOSABS 0.0 04/27/2018 0845   Iron/TIBC/Ferritin/ %Sat No results found for: IRON, TIBC, FERRITIN, IRONPCTSAT Lipid Panel     Component Value Date/Time   CHOL 172 12/25/2018   TRIG 70 12/25/2018   Hepatic Function Panel     Component Value Date/Time   PROT 5.6 (L) 04/30/2018 0310   ALBUMIN 2.7 (L) 04/30/2018 0310   AST 12 (Simpson) 12/25/2018   ALT 10 12/25/2018   ALKPHOS 49 12/25/2018   BILITOT 0.7 04/30/2018 0310      Component Value Date/Time   TSH 1.422 01/31/2012 1014   Results for Olivia Simpson, Olivia Simpson (MRN [id number]) as of 03/03/2019 13:50  Ref. Range 12/25/2018 00:00  Vitamin D, 25-Hydroxy Unknown 34   OBESITY BEHAVIORAL INTERVENTION VISIT  Today's visit was #3   Starting weight: 237 lbs Starting date: 01/26/2019 Today's weight: 231 lbs  Today's date: 03/03/2019 Total lbs lost to date: 6 At least 15 minutes were spent on discussing the following behavioral intervention visit.    03/03/2019  Height 5\' 2"  (1.575 m)  Weight 231 lb (104.8 kg)  BMI (Calculated) 42.24  BLOOD PRESSURE - SYSTOLIC 101  BLOOD PRESSURE - DIASTOLIC 68   Body Fat % 52.2 %  Total Body Water (lbs) 86 lbs   ASK: We discussed the diagnosis of obesity with Deiondra Simpson [place] today and Novi agreed to give us permission to discuss obesity behavioral modification therapy today.  ASSESS: Olivia Simpson has the diagnosis of obesity and her BMI today is 42.4. Olivia Simpson is in the action stage of change.   ADVISE: Olivia Simpson was educated on the multiple health risks of obesity as well as the benefit of weight loss to improve her health. She was advised of the need for long term treatment and the importance of lifestyle modifications to improve her current health and to decrease her risk of future health problems.  AGREE: Multiple dietary modification options and treatment options were discussed and  Marcile agreed to follow the recommendations documented in the above note.  ARRANGE: Olivia Simpson was educated on the importance of frequent visits to treat obesity as outlined per CMS and USPSTF guidelines and agreed to schedule her next follow up appointment today.  Olivia Simpson, Denise Haag, am acting as Energy managertranscriptionist for [employer] Bensyn Bornemann, DO  I have reviewed the above documentation for accuracy and completeness, and I agree with the above. -Corinna CapraAngel Haani Bakula, DO

## 2019-03-04 ENCOUNTER — Encounter (INDEPENDENT_AMBULATORY_CARE_PROVIDER_SITE_OTHER): Payer: Self-pay | Admitting: Bariatrics

## 2019-03-04 DIAGNOSIS — Z6841 Body Mass Index (BMI) 40.0 and over, adult: Secondary | ICD-10-CM | POA: Insufficient documentation

## 2019-03-04 DIAGNOSIS — R7303 Prediabetes: Secondary | ICD-10-CM | POA: Insufficient documentation

## 2019-03-04 LAB — VITAMIN D 25 HYDROXY (VIT D DEFICIENCY, FRACTURES): Vit D, 25-Hydroxy: 39.6 ng/mL (ref 30.0–100.0)

## 2019-03-04 LAB — COMPREHENSIVE METABOLIC PANEL
ALT: 11 IU/L (ref 0–32)
AST: 15 IU/L (ref 0–40)
Albumin/Globulin Ratio: 1.5 (ref 1.2–2.2)
Albumin: 4.5 g/dL (ref 3.8–4.8)
Alkaline Phosphatase: 61 IU/L (ref 39–117)
BUN/Creatinine Ratio: 19 (ref 12–28)
BUN: 17 mg/dL (ref 8–27)
Bilirubin Total: 0.2 mg/dL (ref 0.0–1.2)
CO2: 27 mmol/L (ref 20–29)
Calcium: 9.4 mg/dL (ref 8.7–10.3)
Chloride: 103 mmol/L (ref 96–106)
Creatinine, Ser: 0.91 mg/dL (ref 0.57–1.00)
GFR calc Af Amer: 74 mL/min/{1.73_m2} (ref >59)
GFR calc non Af Amer: 64 mL/min/{1.73_m2} (ref >59)
Globulin, Total: 3 g/dL (ref 1.5–4.5)
Glucose: 98 mg/dL (ref 65–99)
Potassium: 3.9 mmol/L (ref 3.5–5.2)
Sodium: 141 mmol/L (ref 134–144)
Total Protein: 7.5 g/dL (ref 6.0–8.5)

## 2019-03-04 LAB — INSULIN, RANDOM: INSULIN: 24.3 u[IU]/mL (ref 2.6–24.9)

## 2019-03-04 LAB — HEMOGLOBIN A1C
Est. average glucose Bld gHb Est-mCnc: 120 mg/dL
Hgb A1c MFr Bld: 5.8 % — ABNORMAL HIGH (ref 4.8–5.6)

## 2019-03-24 ENCOUNTER — Encounter (INDEPENDENT_AMBULATORY_CARE_PROVIDER_SITE_OTHER): Payer: Self-pay | Admitting: Bariatrics

## 2019-03-24 ENCOUNTER — Other Ambulatory Visit: Payer: Self-pay

## 2019-03-24 ENCOUNTER — Ambulatory Visit (INDEPENDENT_AMBULATORY_CARE_PROVIDER_SITE_OTHER): Payer: Medicare HMO | Admitting: Bariatrics

## 2019-03-24 VITALS — BP 105/71 | HR 69 | Temp 98.1°F | Ht 62.0 in | Wt 230.0 lb

## 2019-03-24 DIAGNOSIS — Z6841 Body Mass Index (BMI) 40.0 and over, adult: Secondary | ICD-10-CM | POA: Diagnosis not present

## 2019-03-24 DIAGNOSIS — E559 Vitamin D deficiency, unspecified: Secondary | ICD-10-CM

## 2019-03-24 DIAGNOSIS — R7303 Prediabetes: Secondary | ICD-10-CM

## 2019-03-24 DIAGNOSIS — I10 Essential (primary) hypertension: Secondary | ICD-10-CM | POA: Diagnosis not present

## 2019-03-24 MED ORDER — VITAMIN D (ERGOCALCIFEROL) 1.25 MG (50000 UNIT) PO CAPS: 50000.0000 [IU] | ORAL_CAPSULE | ORAL | 0 refills | Status: DC

## 2019-03-24 NOTE — Progress Notes (Signed)
Office: [phone]  /  Fax: [phone]   HPI:   Chief Complaint: OBESITY Olivia Simpson is here to discuss her progress with her obesity treatment plan. She is on the Category 4 plan and is following her eating plan approximately 70% of the time. She states she is exercising 0 minutes 0 times per week. Olivia Simpson is down 1 lb. She struggled slightly over the holidays. She states she is not eating any friend foods.  Her weight is 230 lb (104.3 kg) today and has had a weight loss of 1 pound over a period of 3 weeks since her last visit. She has lost 7 lbs since starting treatment with [name].  Pre-Diabetes Olivia Simpson has a diagnosis of prediabetes based on her elevated Hgb A1c and was informed this puts her at greater risk of developing diabetes. Last A1c 5.8 on 03/03/2019 with an insulin of 24.3. She is currently on no medications and continues to work on diet and exercise to decrease risk of diabetes. She denies nausea or hypoglycemia. No polyphagia.  Hypertension Olivia Simpson is a 68 y.o. female with hypertension.  Olivia Simpson denies chest pain or shortness of breath on exertion. She is working weight loss to help control her blood pressure with the goal of decreasing her risk of heart attack and stroke. Olivia Simpson's blood pressure is well controlled.  Vitamin D deficiency Olivia Simpson has a diagnosis of Vitamin D deficiency. Last Vitamin D 39.6 on 03/03/2019. She is currently taking prescription Vit D and denies nausea, vomiting or muscle weakness.  ASSESSMENT AND PLAN:  Prediabetes  Essential hypertension  Vitamin D deficiency - Plan: Vitamin D, Ergocalciferol, (DRISDOL) 1.25 MG (50000 UT) CAPS capsule  Class 3 severe obesity with serious comorbidity and body mass index (BMI) of 40.0 to 44.9 in adult, unspecified obesity type (HCC)  PLAN:  Pre-Diabetes Olivia Simpson will continue to work on weight loss, exercise, decreasing carbohydrates, increasing protein and healthy fats to help decrease the  risk of diabetes.   Hypertension Olivia Simpson is working on healthy weight loss and exercise to improve blood pressure control. She will decrease her sodium intake and continue weight loss. We will watch for signs of hypotension as she continues her lifestyle modifications.  Vitamin D Deficiency Olivia Simpson was informed that low Vitamin D levels contributes to fatigue and are associated with obesity, breast, and colon cancer. She agrees to continue to take prescription Vit D @ 50,000 IU every week #4 with 0 refills and will follow-up for routine testing of Vitamin D, at least 2-3 times per year. She was informed of the risk of over-replacement of Vitamin D and agrees to not increase her dose unless she discusses this with [name] first. Olivia Simpson agrees to follow-up with our clinic in 2-4 weeks.  Obesity Olivia Simpson is currently in the action stage of change. As such, her goal is to continue with weight loss efforts. She has agreed to follow the Category 3 plan with additional lunch options. Olivia Simpson will work on meal planning and increase her water intake to 64 ounces daily. We reviewed lab results including CMP and Vitamin D. Olivia Simpson has been instructed to increase her steps (has Education officer, museum) for weight loss and overall health benefits. We discussed the following Behavioral Modification Strategies today: increasing lean protein intake, decreasing simple carbohydrates, increasing vegetables, increase H20 intake, decrease eating out, no skipping meals, work on meal planning and easy cooking plans, keeping healthy foods in the home, and planning for success.  Olivia Simpson has agreed to follow-up with our  clinic in 2-4 weeks. She was informed of the importance of frequent follow-up visits to maximize her success with intensive lifestyle modifications for her multiple health conditions.  ALLERGIES: Allergies  Allergen Reactions  . Sulfa Antibiotics Swelling    SWELLING REACTION UNSPECIFIED   . Codeine Nausea And Vomiting     MEDICATIONS: Current Outpatient Medications on File Prior to Visit  Medication Sig Dispense Refill  . aspirin 81 MG tablet Take 81 mg by mouth daily.    . cholecalciferol (VITAMIN D3) 25 MCG (1000 UT) tablet Take 1,000 Units by mouth daily.    [name] ibuprofen (ADVIL,MOTRIN) 600 MG tablet Take 600 mg by mouth every 6 (six) hours as needed for headache or mild pain.    . Melatonin 5 MG CAPS Take by mouth daily.    . Multiple Vitamin (MULTIVITAMIN) capsule Take 1 capsule by mouth daily.    [name] omeprazole (PRILOSEC) 20 MG capsule Take 20 mg by mouth daily.     No current facility-administered medications on file prior to visit.     PAST MEDICAL HISTORY: Past Medical History:  Diagnosis Date  . Arthritis of knee   . Back pain   . Edema, lower extremity   . Gallbladder problem   . Hypertension   . IBS (irritable bowel syndrome)   . Joint pain   . Lumbar stenosis   . Snoring   . Vitamin D deficiency   . Vitamin D deficiency     PAST SURGICAL HISTORY: Past Surgical History:  Procedure Laterality Date  . BREAST EXCISIONAL BIOPSY Right   . BREAST LUMPECTOMY  1971  . BUNIONECTOMY Right 2005  . CHOLECYSTECTOMY N/A 04/28/2018   Procedure: LAPAROSCOPIC CHOLECYSTECTOMY WITH INTRAOPERATIVE CHOLANGIOGRAM;  Surgeon: Donnie Mesa, MD;  Location: [facility];  Service: General;  Laterality: N/A;  . hammertoe removal  03/20/13  . ORTHOPEDIC SURGERY Left   . ORTHOPEDIC SURGERY Right   . PARTIAL HYSTERECTOMY  2002    SOCIAL HISTORY: Social History   Tobacco Use  . Smoking status: Never Smoker  . Smokeless tobacco: Never Used  Substance Use Topics  . Alcohol use: No  . Drug use: No    FAMILY HISTORY: Family History  Problem Relation Age of Onset  . Colon cancer Father   . High blood pressure Father   . Pancreatic cancer Mother   . Diabetes Mother   . High blood pressure Mother   . Diabetes Brother   . Diabetes Sister    ROS: Review of Systems  Respiratory: Negative for shortness of  breath.   Cardiovascular: Negative for chest pain.  Gastrointestinal: Negative for nausea and vomiting.  Musculoskeletal:       Negative for muscle weakness.  Endo/Heme/Allergies:       Negative for hypoglycemia. Negative for polyphagia.   PHYSICAL EXAM: Blood pressure 105/71, pulse 69, temperature 98.1 F (36.7 C), height 5\' 2"  (1.575 m), weight 230 lb (104.3 kg), SpO2 100 %. Body mass index is 42.07 kg/m. Physical Exam Vitals signs reviewed.  Constitutional:      Appearance: Normal appearance. She is obese.  Cardiovascular:     Rate and Rhythm: Normal rate.     Pulses: Normal pulses.  Pulmonary:     Effort: Pulmonary effort is normal.     Breath sounds: Normal breath sounds.  Musculoskeletal: Normal range of motion.  Skin:    General: Skin is warm and dry.  Neurological:     Mental Status: She is alert and oriented to person, place,  and time.  Psychiatric:        Behavior: Behavior normal.   RECENT LABS AND TESTS: BMET    Component Value Date/Time   NA 141 03/03/2019 0815   K 3.9 03/03/2019 0815   CL 103 03/03/2019 0815   CO2 27 03/03/2019 0815   GLUCOSE 98 03/03/2019 0815   GLUCOSE 100 (H) 04/30/2018 0310   BUN 17 03/03/2019 0815   CREATININE 0.91 03/03/2019 0815   CREATININE 0.77 01/31/2012 1014   CALCIUM 9.4 03/03/2019 0815   GFRNONAA 64 03/03/2019 0815   GFRAA 74 03/03/2019 0815   Lab Results  Component Value Date   HGBA1C 5.8 (H) 03/03/2019   Lab Results  Component Value Date   INSULIN 24.3 03/03/2019   CBC    Component Value Date/Time   WBC 10.7 (H) 04/30/2018 0310   RBC 3.47 (L) 04/30/2018 0310   HGB 12.8 12/25/2018   HCT 38 12/25/2018   PLT 211 12/25/2018   MCV 89.3 04/30/2018 0310   MCH 28.5 04/30/2018 0310   MCHC 31.9 04/30/2018 0310   RDW 15.3 04/30/2018 0310   LYMPHSABS 1.5 04/27/2018 0845   MONOABS 0.4 04/27/2018 0845   EOSABS 0.0 04/27/2018 0845   BASOSABS 0.0 04/27/2018 0845   Iron/TIBC/Ferritin/ %Sat No results found for:  IRON, TIBC, FERRITIN, IRONPCTSAT Lipid Panel     Component Value Date/Time   CHOL 172 12/25/2018   TRIG 70 12/25/2018   Hepatic Function Panel     Component Value Date/Time   PROT 7.5 03/03/2019 0815   ALBUMIN 4.5 03/03/2019 0815   AST 15 03/03/2019 0815   ALT 11 03/03/2019 0815   ALKPHOS 61 03/03/2019 0815   BILITOT 0.2 03/03/2019 0815      Component Value Date/Time   TSH 1.422 01/31/2012 1014   Results for Mealor, Sharry A (MRN [id number]) as of 03/24/2019 11:00  Ref. Range 03/03/2019 08:15  Vitamin D, 25-Hydroxy Latest Ref Range: 30.0 - 100.0 ng/mL 39.6   OBESITY BEHAVIORAL INTERVENTION VISIT  Today's visit was #4  Starting weight: 237 lbs Starting date: 01/26/2019 Today's weight: 230 lbs  Today's date: 03/24/2019 Total lbs lost to date: 7  At least 15 minutes were spent on discussing the following behavioral intervention visit.    03/24/2019  Height 5\' 2"  (1.575 m)  Weight 230 lb (104.3 kg)  BMI (Calculated) 42.06  BLOOD PRESSURE - SYSTOLIC 105  BLOOD PRESSURE - DIASTOLIC 71   Body Fat % 49.1 %  Total Body Water (lbs) 83.4 lbs   ASK: We discussed the diagnosis of obesity with Olivia Simpson today and Olivia Simpson agreed to give permission to discuss obesity behavioral modification therapy today.  ASSESS: Olivia Simpson has the diagnosis of obesity and her BMI today is 42.1. Olivia Simpson is in the action stage of change.   ADVISE: Olivia Simpson was educated on the multiple health risks of obesity as well as the benefit of weight loss to improve her health. She was advised of the need for long term treatment and the importance of lifestyle modifications to improve her current health and to decrease her risk of future health problems.  AGREE: Multiple dietary modification options and treatment options were discussed and  Olivia Simpson agreed to follow the recommendations documented in the above note.  ARRANGE: Yeily was educated on the importance of frequent visits to treat obesity  as outlined per CMS and USPSTF guidelines and agreed to schedule her next follow up appointment today.  Park Meo, am acting as Fernanda Drum for  Corinna CapraAngel Aithana Kushner, DO  I have reviewed the above documentation for accuracy and completeness, and I agree with the above. -Corinna CapraAngel Turon Kilmer, DO

## 2019-04-01 ENCOUNTER — Other Ambulatory Visit (INDEPENDENT_AMBULATORY_CARE_PROVIDER_SITE_OTHER): Payer: Self-pay | Admitting: Bariatrics

## 2019-04-01 DIAGNOSIS — E559 Vitamin D deficiency, unspecified: Secondary | ICD-10-CM

## 2019-04-07 ENCOUNTER — Encounter (INDEPENDENT_AMBULATORY_CARE_PROVIDER_SITE_OTHER): Payer: Self-pay | Admitting: Bariatrics

## 2019-04-07 ENCOUNTER — Other Ambulatory Visit: Payer: Self-pay

## 2019-04-07 ENCOUNTER — Ambulatory Visit (INDEPENDENT_AMBULATORY_CARE_PROVIDER_SITE_OTHER): Payer: Medicare HMO | Admitting: Bariatrics

## 2019-04-07 VITALS — BP 132/81 | HR 72 | Temp 98.7°F | Ht 62.0 in | Wt 231.0 lb

## 2019-04-07 DIAGNOSIS — Z6841 Body Mass Index (BMI) 40.0 and over, adult: Secondary | ICD-10-CM

## 2019-04-07 DIAGNOSIS — R7303 Prediabetes: Secondary | ICD-10-CM

## 2019-04-07 DIAGNOSIS — K5909 Other constipation: Secondary | ICD-10-CM | POA: Diagnosis not present

## 2019-04-07 DIAGNOSIS — I10 Essential (primary) hypertension: Secondary | ICD-10-CM | POA: Diagnosis not present

## 2019-04-07 NOTE — Progress Notes (Signed)
Office: [phone]  /  Fax: [phone]   HPI:  Chief Complaint: OBESITY Olivia Simpson is here to discuss her progress with her obesity treatment plan. She is on the Category 3 plan and states she is following her eating plan approximately 80% of the time. She states she is exercising 0 minutes 0 times per week.  Olivia Simpson gained 1 lb. She has been having back pain and had been on Gabapentin, but is scheduled for an injection. She reports drinking more water.  Today's visit was #5 Starting weight: 237 lbs Starting date: 01/26/2019 Today's weight : 231 lbs Today's date: 04/07/2019 Total lbs lost to date: 6 Total lbs lost since last in-office visit: 0   Prediabetes Olivia Simpson has a diagnosis of prediabetes. She reports no excessive appetite. Last A1c 5.8 on 03/03/2019 with an insulin of 24.3.  Hypertension Olivia Simpson has a diagnosis of hypertension and is well controlled on no medications. Blood pressure today is 132/81.   Constipation/IBS Olivia Simpson has constipation/IBS. She is taking Correctol and taking Linzess as needed. She reports gas and bloating.  ASSESSMENT AND PLAN:  Prediabetes  Essential hypertension  Other constipation  Class 3 severe obesity with serious comorbidity and body mass index (BMI) of 40.0 to 44.9 in adult, unspecified obesity type (HCC)  PLAN:  Pre-Diabetes Olivia Simpson will continue to work on weight loss, exercise, decreasing carbohydrates, and increasing healthy fats and protein to help decrease the risk of diabetes.   Hypertension Olivia Simpson is working on healthy weight loss and exercise to improve blood pressure control. We will watch for signs of hypotension as she continues her lifestyle modifications. She was advised to avoid adding salt to her diet.  Constipation/IBS Olivia Simpson will continue Linzess and follow-up as directed.  Obesity Olivia Simpson is currently in the action stage of change. As such, her goal is to continue with weight loss efforts. She has  agreed to follow the Category 3 plan. Olivia Simpson will work on meal planning, intentional eating, will weigh her meat, and will use measuring cups. Olivia Simpson has been instructed to do Cubii (5 minutes) and weights for weight loss and overall health benefits. We discussed the following Behavioral Modification Strategies today: increasing lean protein intake, decreasing simple carbohydrates, increasing vegetables, increase H20 intake, decrease eating out, no skipping meals, work on meal planning and easy cooking plans, and keeping healthy foods in the home.  Olivia Simpson has agreed to follow-up with our clinic in 2-3 weeks. She was informed of the importance of frequent follow-up visits to maximize her success with intensive lifestyle modifications for her multiple health conditions.  ALLERGIES: Allergies  Allergen Reactions  . Sulfa Antibiotics Swelling    SWELLING REACTION UNSPECIFIED   . Codeine Nausea And Vomiting    MEDICATIONS: Current Outpatient Medications on File Prior to Visit  Medication Sig Dispense Refill  . aspirin 81 MG tablet Take 81 mg by mouth daily.    . cholecalciferol (VITAMIN D3) 25 MCG (1000 UT) tablet Take 1,000 Units by mouth daily.    [name]. ibuprofen (ADVIL,MOTRIN) 600 MG tablet Take 600 mg by mouth every 6 (six) hours as needed for headache or mild pain.    . Melatonin 5 MG CAPS Take by mouth daily.    . Multiple Vitamin (MULTIVITAMIN) capsule Take 1 capsule by mouth daily.    [name]. omeprazole (PRILOSEC) 20 MG capsule Take 20 mg by mouth daily.    . Vitamin D, Ergocalciferol, (DRISDOL) 1.25 MG (50000 UT) CAPS capsule Take 1 capsule (50,000 Units total) by mouth every 7 (  seven) days. 4 capsule 0   No current facility-administered medications on file prior to visit.    PAST MEDICAL HISTORY: Past Medical History:  Diagnosis Date  . Arthritis of knee   . Back pain   . Edema, lower extremity   . Gallbladder problem   . Hypertension   . IBS (irritable bowel syndrome)   . Joint  pain   . Lumbar stenosis   . Snoring   . Vitamin D deficiency   . Vitamin D deficiency     PAST SURGICAL HISTORY: Past Surgical History:  Procedure Laterality Date  . BREAST EXCISIONAL BIOPSY Right   . BREAST LUMPECTOMY  1971  . BUNIONECTOMY Right 2005  . CHOLECYSTECTOMY N/A 04/28/2018   Procedure: LAPAROSCOPIC CHOLECYSTECTOMY WITH INTRAOPERATIVE CHOLANGIOGRAM;  Surgeon: Donnie Mesa, MD;  Location: [facility];  Service: General;  Laterality: N/A;  . hammertoe removal  03/20/13  . ORTHOPEDIC SURGERY Left   . ORTHOPEDIC SURGERY Right   . PARTIAL HYSTERECTOMY  2002    SOCIAL HISTORY: Social History   Tobacco Use  . Smoking status: Never Smoker  . Smokeless tobacco: Never Used  Substance Use Topics  . Alcohol use: No  . Drug use: No    FAMILY HISTORY: Family History  Problem Relation Age of Onset  . Colon cancer Father   . High blood pressure Father   . Pancreatic cancer Mother   . Diabetes Mother   . High blood pressure Mother   . Diabetes Brother   . Diabetes Sister    ROS: Review of Systems  Constitutional: Negative for weight loss.  Gastrointestinal: Positive for constipation.       Positive for IBS.   PHYSICAL EXAM: Blood pressure 132/81, pulse 72, temperature 98.7 F (37.1 C), height 5\' 2"  (1.575 m), weight 231 lb (104.8 kg), SpO2 99 %. Body mass index is 42.25 kg/m. Physical Exam Vitals reviewed.  Constitutional:      Appearance: Normal appearance. She is obese.  Cardiovascular:     Rate and Rhythm: Normal rate.     Pulses: Normal pulses.  Pulmonary:     Effort: Pulmonary effort is normal.     Breath sounds: Normal breath sounds.  Musculoskeletal:        General: Normal range of motion.  Skin:    General: Skin is warm and dry.  Neurological:     Mental Status: She is alert and oriented to person, place, and time.  Psychiatric:        Behavior: Behavior normal.   RECENT LABS AND TESTS: BMET    Component Value Date/Time   NA 141 03/03/2019  0815   K 3.9 03/03/2019 0815   CL 103 03/03/2019 0815   CO2 27 03/03/2019 0815   GLUCOSE 98 03/03/2019 0815   GLUCOSE 100 (H) 04/30/2018 0310   BUN 17 03/03/2019 0815   CREATININE 0.91 03/03/2019 0815   CREATININE 0.77 01/31/2012 1014   CALCIUM 9.4 03/03/2019 0815   GFRNONAA 64 03/03/2019 0815   GFRAA 74 03/03/2019 0815   Lab Results  Component Value Date   HGBA1C 5.8 (H) 03/03/2019   Lab Results  Component Value Date   INSULIN 24.3 03/03/2019   CBC    Component Value Date/Time   WBC 10.7 (H) 04/30/2018 0310   RBC 3.47 (L) 04/30/2018 0310   HGB 12.8 12/25/2018 0000   HCT 38 12/25/2018 0000   PLT 211 12/25/2018 0000   MCV 89.3 04/30/2018 0310   MCH 28.5 04/30/2018 0310  MCHC 31.9 04/30/2018 0310   RDW 15.3 04/30/2018 0310   LYMPHSABS 1.5 04/27/2018 0845   MONOABS 0.4 04/27/2018 0845   EOSABS 0.0 04/27/2018 0845   BASOSABS 0.0 04/27/2018 0845   Iron/TIBC/Ferritin/ %Sat No results found for: IRON, TIBC, FERRITIN, IRONPCTSAT Lipid Panel     Component Value Date/Time   CHOL 172 12/25/2018 0000   TRIG 70 12/25/2018 0000   Hepatic Function Panel     Component Value Date/Time   PROT 7.5 03/03/2019 0815   ALBUMIN 4.5 03/03/2019 0815   AST 15 03/03/2019 0815   ALT 11 03/03/2019 0815   ALKPHOS 61 03/03/2019 0815   BILITOT 0.2 03/03/2019 0815      Component Value Date/Time   TSH 1.422 01/31/2012 1014     OBESITY BEHAVIORAL INTERVENTION VISIT DOCUMENTATION FOR INSURANCE (~15 minutes)  ASK: We discussed the diagnosis of obesity with Deanette A Windhorst today and Aicia agreed to give [name] permission to discuss obesity behavioral modification therapy today.  ASSESS: Carsynn has the diagnosis of obesity and her BMI today is 42.3. Veola is in the action stage of change.   ADVISE: Licet was educated on the multiple health risks of obesity as well as the benefit of weight loss to improve her health. She was advised of the need for long term treatment and the  importance of lifestyle modifications to improve her current health and to decrease her risk of future health problems.  AGREE: Multiple dietary modification options and treatment options were discussed and  Jackilyn agreed to follow the recommendations documented in the above note.  ARRANGE: Kashara was educated on the importance of frequent visits to treat obesity as outlined per CMS and USPSTF guidelines and agreed to schedule her next follow up appointment today.  Fernanda Drum, am acting as Energy manager for [employer], DO  I have reviewed the above documentation for accuracy and completeness, and I agree with the above. -Corinna Capra, DO

## 2019-04-08 ENCOUNTER — Encounter (INDEPENDENT_AMBULATORY_CARE_PROVIDER_SITE_OTHER): Payer: Self-pay | Admitting: Bariatrics

## 2019-04-21 ENCOUNTER — Ambulatory Visit (INDEPENDENT_AMBULATORY_CARE_PROVIDER_SITE_OTHER): Payer: Medicare HMO | Admitting: Family Medicine

## 2019-04-30 ENCOUNTER — Other Ambulatory Visit: Payer: Self-pay

## 2019-04-30 ENCOUNTER — Ambulatory Visit (INDEPENDENT_AMBULATORY_CARE_PROVIDER_SITE_OTHER): Payer: Medicare HMO | Admitting: Bariatrics

## 2019-04-30 VITALS — BP 102/70 | HR 87 | Temp 98.3°F | Ht 62.0 in | Wt 227.0 lb

## 2019-04-30 DIAGNOSIS — E559 Vitamin D deficiency, unspecified: Secondary | ICD-10-CM | POA: Diagnosis not present

## 2019-04-30 DIAGNOSIS — Z6841 Body Mass Index (BMI) 40.0 and over, adult: Secondary | ICD-10-CM | POA: Diagnosis not present

## 2019-04-30 DIAGNOSIS — R7303 Prediabetes: Secondary | ICD-10-CM

## 2019-04-30 MED ORDER — VITAMIN D (ERGOCALCIFEROL) 1.25 MG (50000 UNIT) PO CAPS: 50000.0000 [IU] | ORAL_CAPSULE | ORAL | 0 refills | Status: DC

## 2019-05-04 ENCOUNTER — Encounter (INDEPENDENT_AMBULATORY_CARE_PROVIDER_SITE_OTHER): Payer: Self-pay | Admitting: Bariatrics

## 2019-05-04 NOTE — Progress Notes (Signed)
Chief Complaint:   OBESITY Olivia Simpson is here to discuss her progress with her obesity treatment plan along with follow-up of her obesity related diagnoses. Olivia Simpson is on the Category 3 Plan and states she is following her eating plan approximately 50% of the time. Olivia Simpson states she is exercising for 0 minutes 0 times per week.  Today's visit was #: 6 Starting weight: 237 lbs Starting date: 01/26/2019 Today's weight: 227 lbs Today's date: 04/30/2019 Total lbs lost to date: 10 lbs Total lbs lost since last in-office visit: 4 lbs  Interim History: Olivia Simpson is down 4 pounds since her last visit.  She is drinking more water.  She has been increasing her protein.  Subjective:   1. Vitamin D deficiency Olivia Simpson's Vitamin D level was 39.6 on 03/03/19. She is currently taking vit D. She denies nausea, vomiting or muscle weakness.  2. Prediabetes Olivia Simpson has a diagnosis of prediabetes based on her elevated HgA1c and was informed this puts her at greater risk of developing diabetes. She continues to work on diet and exercise to decrease her risk of diabetes. She denies nausea or hypoglycemia.  She also denies polyphagia.  Lab Results  Component Value Date   HGBA1C 5.8 (H) 03/03/2019   Lab Results  Component Value Date   INSULIN 24.3 03/03/2019   Assessment/Plan:   1. Vitamin D deficiency Low Vitamin D level contributes to fatigue and are associated with obesity, breast, and colon cancer. She agrees to continue to take prescription Vitamin D @50 ,000 IU every week and will follow-up for routine testing of Vitamin D, at least 2-3 times per year to avoid over-replacement. - Vitamin D, Ergocalciferol, (DRISDOL) 1.25 MG (50000 UNIT) CAPS capsule; Take 1 capsule (50,000 Units total) by mouth every 7 (seven) days.  Dispense: 4 capsule; Refill: 0  2. Prediabetes Olivia Simpson will work on decreasing carbohydrates such as sweets and refined, but will ad more healthy carbs such as raw vegetables.  3.  Class 3 severe obesity with serious comorbidity and body mass index (BMI) of 40.0 to 44.9 in adult, unspecified obesity type (HCC) Olivia Simpson is currently in the action stage of change. As such, her goal is to continue with weight loss efforts. She has agreed to the Category 3 Plan.  She will work on meal planning, intentional eating, recipes, and "making smart fruit choices".  Exercise goals: Olivia Simpson is going to begin doing some walking.  Behavioral modification strategies: increasing lean protein intake, decreasing simple carbohydrates, increasing vegetables, increasing water intake, decreasing eating out, no skipping meals, meal planning and cooking strategies and keeping healthy foods in the home.  Olivia Simpson has agreed to follow-up with our clinic in 2 weeks. She was informed of the importance of frequent follow-up visits to maximize her success with intensive lifestyle modifications for her multiple health conditions.   Objective:   Blood pressure 102/70, pulse 87, temperature 98.3 F (36.8 C), height 5\' 2"  (1.575 m), weight 227 lb (103 kg), SpO2 95 %. Body mass index is 41.52 kg/m.  General: Cooperative, alert, well developed, in no acute distress. HEENT: Conjunctivae and lids unremarkable. Cardiovascular: Regular rhythm.  Lungs: Normal work of breathing. Neurologic: No focal deficits.   Lab Results  Component Value Date   CREATININE 0.91 03/03/2019   BUN 17 03/03/2019   NA 141 03/03/2019   K 3.9 03/03/2019   CL 103 03/03/2019   CO2 27 03/03/2019   Lab Results  Component Value Date   ALT 11 03/03/2019   AST 15  03/03/2019   ALKPHOS 61 03/03/2019   BILITOT 0.2 03/03/2019   Lab Results  Component Value Date   HGBA1C 5.8 (H) 03/03/2019   Lab Results  Component Value Date   INSULIN 24.3 03/03/2019   Lab Results  Component Value Date   TSH 1.422 01/31/2012   Lab Results  Component Value Date   CHOL 172 12/25/2018   TRIG 70 12/25/2018   Lab Results  Component Value  Date   WBC 10.7 (H) 04/30/2018   HGB 12.8 12/25/2018   HCT 38 12/25/2018   MCV 89.3 04/30/2018   PLT 211 12/25/2018   No results found for: IRON, TIBC, FERRITIN  Obesity Behavioral Intervention Documentation for Insurance:   Approximately 15 minutes were spent on the discussion below.  ASK: We discussed the diagnosis of obesity with Olivia Simpson today and Olivia Simpson agreed to give [name] permission to discuss obesity behavioral modification therapy today.  ASSESS: Olivia Simpson has the diagnosis of obesity and her BMI today is 41.6. Olivia Simpson is in the action stage of change.   ADVISE: Olivia Simpson was educated on the multiple health risks of obesity as well as the benefit of weight loss to improve her health. She was advised of the need for long term treatment and the importance of lifestyle modifications to improve her current health and to decrease her risk of future health problems.  AGREE: Multiple dietary modification options and treatment options were discussed and Olivia Simpson agreed to follow the recommendations documented in the above note.  ARRANGE: Olivia Simpson was educated on the importance of frequent visits to treat obesity as outlined per CMS and USPSTF guidelines and agreed to schedule her next follow up appointment today.  Attestation Statements:   Reviewed by clinician on day of visit: allergies, medications, problem list, medical history, surgical history, family history, social history, and previous encounter notes.  I, Insurance claims handler, CMA, am acting as Energy manager for [employer], DO.  I have reviewed the above documentation for accuracy and completeness, and I agree with the above. Corinna Capra, DO

## 2019-05-06 ENCOUNTER — Other Ambulatory Visit: Payer: Self-pay | Admitting: Radiology

## 2019-05-14 ENCOUNTER — Other Ambulatory Visit: Payer: Self-pay

## 2019-05-14 ENCOUNTER — Ambulatory Visit (INDEPENDENT_AMBULATORY_CARE_PROVIDER_SITE_OTHER): Payer: Medicare HMO | Admitting: Family Medicine

## 2019-05-14 VITALS — BP 110/75 | HR 88 | Temp 98.1°F | Ht 62.0 in | Wt 227.0 lb

## 2019-05-14 DIAGNOSIS — Z6841 Body Mass Index (BMI) 40.0 and over, adult: Secondary | ICD-10-CM

## 2019-05-14 DIAGNOSIS — E559 Vitamin D deficiency, unspecified: Secondary | ICD-10-CM

## 2019-05-14 DIAGNOSIS — G4709 Other insomnia: Secondary | ICD-10-CM

## 2019-05-14 MED ORDER — VITAMIN D (ERGOCALCIFEROL) 1.25 MG (50000 UNIT) PO CAPS: 50000.0000 [IU] | ORAL_CAPSULE | ORAL | 0 refills | Status: DC

## 2019-05-14 NOTE — Progress Notes (Signed)
Chief Complaint:   OBESITY Olivia Simpson is here to discuss her progress with her obesity treatment plan along with follow-up of her obesity related diagnoses. Olivia Simpson is on the Category 3 Plan and states she is following her eating plan approximately 60% of the time. Olivia Simpson states she is exercising for 0 minutes 0 times per week.  Today's visit was #: 7 Starting weight: 237 lbs Starting date: 01/26/2019 Today's weight: 227 lbs Today's date: 05/14/2019 Total lbs lost to date: 10 lbs Total lbs lost since last in-office visit: 0  Interim History: Olivia Simpson reports that she has been under increased stress due to an abnormal mammogram.  She had to have a needle biopsy performed which came back benign. The added stress has caused her to be off plan. She says that she does not eat all of the food on the plan.  Subjective:   1. Vitamin D deficiency Olivia Simpson's Vitamin D level was 39.6 on 03/03/2019. She is currently taking vit D. Vitamin D level is currently not at goal.  Patient denies fatigue.  2. Other insomnia Olivia Simpson reports that she has trouble going to sleep.  She takes melatonin and feels this helps, but she is out of it right now.  Assessment/Plan:   1. Vitamin D deficiency Low Vitamin D level contributes to fatigue and are associated with obesity, breast, and colon cancer. She agrees to continue to take prescription Vitamin D @50 ,000 IU every week and will follow-up for routine testing of Vitamin D, at least 2-3 times per year to avoid over-replacement. - Vitamin D, Ergocalciferol, (DRISDOL) 1.25 MG (50000 UNIT) CAPS capsule; Take 1 capsule (50,000 Units total) by mouth every 7 (seven) days.  Dispense: 4 capsule; Refill: 0  2. Other insomnia Discussed tips for good sleep hygiene. Continue melatonin for insomnia.  3. Class 3 severe obesity with serious comorbidity and body mass index (BMI) of 40.0 to 44.9 in adult, unspecified obesity type (HCC) Olivia Simpson is currently in the action  stage of change. As such, her goal is to continue with weight loss efforts. She has agreed to the Category 3 Plan.   Exercise goals: No exercise has been prescribed at this time.  Behavioral modification strategies: increasing lean protein intake, increasing water intake and planning for success.  Olivia Simpson has agreed to follow-up with our clinic in 2 weeks. She was informed of the importance of frequent follow-up visits to maximize her success with intensive lifestyle modifications for her multiple health conditions.   Objective:   Blood pressure 110/75, pulse 88, temperature 98.1 F (36.7 C), temperature source Oral, height 5\' 2"  (1.575 m), weight 227 lb (103 kg), SpO2 99 %. Body mass index is 41.52 kg/m.  General: Cooperative, alert, well developed, in no acute distress. HEENT: Conjunctivae and lids unremarkable. Cardiovascular: Regular rhythm.  Lungs: Normal work of breathing. Neurologic: No focal deficits.   Lab Results  Component Value Date   CREATININE 0.91 03/03/2019   BUN 17 03/03/2019   NA 141 03/03/2019   K 3.9 03/03/2019   CL 103 03/03/2019   CO2 27 03/03/2019   Lab Results  Component Value Date   ALT 11 03/03/2019   AST 15 03/03/2019   ALKPHOS 61 03/03/2019   BILITOT 0.2 03/03/2019   Lab Results  Component Value Date   HGBA1C 5.8 (H) 03/03/2019   Lab Results  Component Value Date   INSULIN 24.3 03/03/2019   Lab Results  Component Value Date   TSH 1.422 01/31/2012   Lab Results  Component Value Date   CHOL 172 12/25/2018   TRIG 70 12/25/2018   Lab Results  Component Value Date   WBC 10.7 (H) 04/30/2018   HGB 12.8 12/25/2018   HCT 38 12/25/2018   MCV 89.3 04/30/2018   PLT 211 12/25/2018   Obesity Behavioral Intervention Documentation for Insurance:   Approximately 15 minutes were spent on the discussion below.  ASK: We discussed the diagnosis of obesity with Olivia Simpson today and Olivia Simpson agreed to give [name] permission to discuss obesity behavioral  modification therapy today.  ASSESS: Olivia Simpson has the diagnosis of obesity and her BMI today is 41.6. Olivia Simpson is in the action stage of change.   ADVISE: Olivia Simpson was educated on the multiple health risks of obesity as well as the benefit of weight loss to improve her health. She was advised of the need for long term treatment and the importance of lifestyle modifications to improve her current health and to decrease her risk of future health problems.  AGREE: Multiple dietary modification options and treatment options were discussed and Olivia Simpson agreed to follow the recommendations documented in the above note.  ARRANGE: Olivia Simpson was educated on the importance of frequent visits to treat obesity as outlined per CMS and USPSTF guidelines and agreed to schedule her next follow up appointment today.  Attestation Statements:   Reviewed by clinician on day of visit: allergies, medications, problem list, medical history, surgical history, family history, social history, and previous encounter notes.  I, Water quality scientist, CMA, am acting as Location manager for [employer], FNP-C.  I have reviewed the above documentation for accuracy and completeness, and I agree with the above. -  Georgianne Fick, FNP

## 2019-05-18 ENCOUNTER — Encounter (INDEPENDENT_AMBULATORY_CARE_PROVIDER_SITE_OTHER): Payer: Self-pay | Admitting: Family Medicine

## 2019-05-28 ENCOUNTER — Other Ambulatory Visit: Payer: Self-pay

## 2019-05-28 ENCOUNTER — Encounter (INDEPENDENT_AMBULATORY_CARE_PROVIDER_SITE_OTHER): Payer: Self-pay | Admitting: Bariatrics

## 2019-05-28 ENCOUNTER — Ambulatory Visit (INDEPENDENT_AMBULATORY_CARE_PROVIDER_SITE_OTHER): Payer: Medicare HMO | Admitting: Bariatrics

## 2019-05-28 VITALS — BP 115/74 | HR 59 | Temp 98.0°F | Ht 62.0 in | Wt 227.0 lb

## 2019-05-28 DIAGNOSIS — E66813 Obesity, class 3: Secondary | ICD-10-CM

## 2019-05-28 DIAGNOSIS — E559 Vitamin D deficiency, unspecified: Secondary | ICD-10-CM

## 2019-05-28 DIAGNOSIS — Z6841 Body Mass Index (BMI) 40.0 and over, adult: Secondary | ICD-10-CM | POA: Diagnosis not present

## 2019-05-28 DIAGNOSIS — I10 Essential (primary) hypertension: Secondary | ICD-10-CM

## 2019-05-28 MED ORDER — VITAMIN D (ERGOCALCIFEROL) 1.25 MG (50000 UNIT) PO CAPS: 50000.0000 [IU] | ORAL_CAPSULE | ORAL | 0 refills | Status: DC

## 2019-06-01 ENCOUNTER — Encounter (INDEPENDENT_AMBULATORY_CARE_PROVIDER_SITE_OTHER): Payer: Self-pay | Admitting: Bariatrics

## 2019-06-01 NOTE — Progress Notes (Signed)
Chief Complaint:   OBESITY Olivia Simpson is here to discuss her progress with her obesity treatment plan along with follow-up of her obesity related diagnoses. Olivia Simpson is on the Category 3 Plan and states she is following her eating plan approximately 70 to 75% of the time. Olivia Simpson states she is walking 90 minutes 3 times per week.  Today's visit was #: 8 Starting weight: 237 lbs Starting date: 01/26/2019 Today's weight: 227 lbs Today's date: 05/28/2019 Total lbs lost to date: 10 Total lbs lost since last in-office visit: 10  Interim History: Olivia Simpson's weight remains the same. She indulged with the Super Bowl. She is doing better with her water.  Subjective:   Vitamin D deficiency Olivia Simpson Vitamin D level was 39.6 on 03/02/20. She is currently taking vit D. She denies nausea, vomiting or muscle weakness.  Essential hypertension Olivia Simpson is currently taking Lisinopril. Her blood pressure is controlled.  BP Readings from Last 3 Encounters:  05/28/19 115/74  05/14/19 110/75  04/30/19 102/70   Lab Results  Component Value Date   CREATININE 0.91 03/03/2019   CREATININE 0.9 12/25/2018   CREATININE 0.88 04/30/2018    Assessment/Plan:   Vitamin D deficiency  Low Vitamin D level contributes to fatigue and are associated with obesity, breast, and colon cancer. Olivia Simpson agrees to continue to take prescription Vitamin D @50 ,000 IU every week #4 with no refills and she will follow-up for routine testing of Vitamin D, at least 2-3 times per year to avoid over-replacement.  Essential hypertension Olivia Simpson is working on healthy weight loss and exercise to improve blood pressure control. Olivia Simpson will continue with her current medications. We will watch for signs of hypotension as she continues her lifestyle modifications.  Class 3 severe obesity with serious comorbidity and body mass index (BMI) of 40.0 to 44.9 in adult, unspecified obesity type (HCC) Olivia Simpson is currently in the  action stage of change. As such, her goal is to continue with weight loss efforts. She has agreed to the Category 3 Plan.   Exercise goals: Olivia Simpson will continue to walk.  Behavioral modification strategies: increasing lean protein intake, decreasing simple carbohydrates, increasing vegetables, increasing water intake, decreasing eating out, decreasing snacking, no skipping meals, meal planning and mindful eating, keeping healthy foods in the home, ways to avoid night time snacking, better snacking choices and planning for success. Olivia Simpson will weight her meat at dinner.  Olivia Simpson has agreed to follow-up with our clinic in 2 weeks. She was informed of the importance of frequent follow-up visits to maximize her success with intensive lifestyle modifications for her multiple health conditions.   Objective:   Blood pressure 115/74, pulse (!) 59, temperature 98 F (36.7 C), height 5\' 2"  (1.575 m), weight 227 lb (103 kg), SpO2 99 %. Body mass index is 41.52 kg/m.  General: Cooperative, alert, well developed, in no acute distress. HEENT: Conjunctivae and lids unremarkable. Cardiovascular: Regular rhythm.  Lungs: Normal work of breathing. Neurologic: No focal deficits.   Lab Results  Component Value Date   CREATININE 0.91 03/03/2019   BUN 17 03/03/2019   NA 141 03/03/2019   K 3.9 03/03/2019   CL 103 03/03/2019   CO2 27 03/03/2019   Lab Results  Component Value Date   ALT 11 03/03/2019   AST 15 03/03/2019   ALKPHOS 61 03/03/2019   BILITOT 0.2 03/03/2019   Lab Results  Component Value Date   HGBA1C 5.8 (H) 03/03/2019   Lab Results  Component Value Date  INSULIN 24.3 03/03/2019   Lab Results  Component Value Date   TSH 1.422 01/31/2012   Lab Results  Component Value Date   CHOL 172 12/25/2018   TRIG 70 12/25/2018   Lab Results  Component Value Date   WBC 10.7 (H) 04/30/2018   HGB 12.8 12/25/2018   HCT 38 12/25/2018   MCV 89.3 04/30/2018   PLT 211 12/25/2018   No  results found for: IRON, TIBC, FERRITIN   Ref. Range 03/03/2019 08:15  Vitamin D, 25-Hydroxy Latest Ref Range: 30.0 - 100.0 ng/mL 39.6   Obesity Behavioral Intervention Documentation for Insurance:   Approximately 15 minutes were spent on the discussion below.  ASK: We discussed the diagnosis of obesity with Olivia Simpson today and Olivia Simpson agreed to give [name] permission to discuss obesity behavioral modification therapy today.  ASSESS: Olivia Simpson has the diagnosis of obesity and her BMI today is 41.51. Olivia Simpson is in the action stage of change.   ADVISE: Olivia Simpson was educated on the multiple health risks of obesity as well as the benefit of weight loss to improve her health. She was advised of the need for long term treatment and the importance of lifestyle modifications to improve her current health and to decrease her risk of future health problems.  AGREE: Multiple dietary modification options and treatment options were discussed and Olivia Simpson agreed to follow the recommendations documented in the above note.  ARRANGE: Olivia Simpson was educated on the importance of frequent visits to treat obesity as outlined per CMS and USPSTF guidelines and agreed to schedule her next follow up appointment today.  Attestation Statements:   Reviewed by clinician on day of visit: allergies, medications, problem list, medical history, surgical history, family history, social history, and previous encounter notes.  Corey Skains, am acting as Location manager for [employer]. Owens Shark, DO.  I have reviewed the above documentation for accuracy and completeness, and I agree with the above. -  Lynx Goodrich A. Owens Shark, DO

## 2019-06-11 ENCOUNTER — Ambulatory Visit (INDEPENDENT_AMBULATORY_CARE_PROVIDER_SITE_OTHER): Payer: Medicare HMO | Admitting: Bariatrics

## 2019-06-16 ENCOUNTER — Other Ambulatory Visit: Payer: Self-pay

## 2019-06-16 ENCOUNTER — Ambulatory Visit (INDEPENDENT_AMBULATORY_CARE_PROVIDER_SITE_OTHER): Payer: Medicare HMO | Admitting: Bariatrics

## 2019-06-16 VITALS — BP 101/70 | HR 68 | Temp 98.3°F | Ht 62.0 in | Wt 226.0 lb

## 2019-06-16 DIAGNOSIS — K219 Gastro-esophageal reflux disease without esophagitis: Secondary | ICD-10-CM

## 2019-06-16 DIAGNOSIS — Z6841 Body Mass Index (BMI) 40.0 and over, adult: Secondary | ICD-10-CM

## 2019-06-16 DIAGNOSIS — I10 Essential (primary) hypertension: Secondary | ICD-10-CM | POA: Diagnosis not present

## 2019-06-16 NOTE — Progress Notes (Signed)
Chief Complaint:   OBESITY Olivia Simpson is here to discuss her progress with her obesity treatment plan along with follow-up of her obesity related diagnoses. Olivia Simpson is on the Category 3 Plan and states she is following her eating plan approximately 75% of the time.   Today's visit was #: 9 Starting weight: 237 lbs Starting date: 01/26/2019 Today's weight: 226 lbs Today's date: 06/16/2019 Total lbs lost to date: 11 Total lbs lost since last in-office visit: 1  Interim History: Olivia Simpson is down 1 lb. She walked too much several days ago. She has seen the orthopedist in the past and is ready for a cortisone injection in the right knee.  Subjective:   Essential hypertension. Raylynne is taking lisinopril. Blood pressure is under good control.  BP Readings from Last 3 Encounters:  06/16/19 101/70  05/28/19 115/74  05/14/19 110/75   Lab Results  Component Value Date   CREATININE 0.91 03/03/2019   CREATININE 0.9 12/25/2018   CREATININE 0.88 04/30/2018   Gastroesophageal reflux disease, unspecified whether esophagitis present. [name] is taking Prilosec.  Assessment/Plan:   Essential hypertension. [name] is working on healthy weight loss and exercise to improve blood pressure control. We will watch for signs of hypotension as she continues her lifestyle modifications. She will continue medications as directed.  Gastroesophageal reflux disease, unspecified whether esophagitis present. Intensive lifestyle modifications are the first line treatment for this issue. We discussed several lifestyle modifications today and she will continue to work on diet, exercise and weight loss efforts. Orders and follow up as documented in patient record. Olivia Simpson will continue Prilosec as directed.  Counseling . If a person has gastroesophageal reflux disease (GERD), food and stomach acid move back up into the esophagus and cause symptoms or problems such as damage to the  esophagus. . Anti-reflux measures include: raising the head of the bed, avoiding tight clothing or belts, avoiding eating late at night, not lying down shortly after mealtime, and achieving weight loss. . Avoid ASA, NSAID's, caffeine, alcohol, and tobacco.  . OTC Pepcid and/or Tums are often very helpful for as needed use.  [name] However, for persisting chronic or daily symptoms, stronger medications like Omeprazole may be needed. . You may need to avoid foods and drinks such as: ? Coffee and tea (with or without caffeine). ? Drinks that contain alcohol. ? Energy drinks and sports drinks. ? Bubbly (carbonated) drinks or sodas. ? Chocolate and cocoa. ? Peppermint and mint flavorings. ? Garlic and onions. ? Horseradish. ? Spicy and acidic foods. These include peppers, chili powder, curry powder, vinegar, hot sauces, and BBQ sauce. ? Citrus fruit juices and citrus fruits, such as oranges, lemons, and limes. ? Tomato-based foods. These include red sauce, chili, salsa, and pizza with red sauce. ? Fried and fatty foods. These include donuts, french fries, potato chips, and high-fat dressings. ? High-fat meats. These include hot dogs, rib eye steak, sausage, ham, and bacon.  Class 3 severe obesity with serious comorbidity and body mass index (BMI) of 40.0 to 44.9 in adult, unspecified obesity type ([facility]).  Olivia Simpson is currently in the action stage of change. As such, her goal is to continue with weight loss efforts. She has agreed to the Category 3 Plan.   She will work on meal planning, intentional eating, and increasing her water intake.  Exercise goals: Olivia Simpson will walk but decreased frequency and intensity.  Behavioral modification strategies: increasing lean protein intake, decreasing simple carbohydrates, increasing vegetables, increasing water intake, decreasing  eating out, no skipping meals, meal planning and cooking strategies, keeping healthy foods in the home, better snacking choices,  emotional eating strategies and planning for success.  Olivia Simpson has agreed to follow-up with our clinic in 2 weeks. She was informed of the importance of frequent follow-up visits to maximize her success with intensive lifestyle modifications for her multiple health conditions.   Objective:   Blood pressure 101/70, pulse 68, temperature 98.3 F (36.8 C), height 5\' 2"  (1.575 m), weight 226 lb (102.5 kg), SpO2 97 %. Body mass index is 41.34 kg/m.  General: Cooperative, alert, well developed, in no acute distress. HEENT: Conjunctivae and lids unremarkable. Cardiovascular: Regular rhythm.  Lungs: Normal work of breathing. Neurologic: No focal deficits.   Lab Results  Component Value Date   CREATININE 0.91 03/03/2019   BUN 17 03/03/2019   NA 141 03/03/2019   K 3.9 03/03/2019   CL 103 03/03/2019   CO2 27 03/03/2019   Lab Results  Component Value Date   ALT 11 03/03/2019   AST 15 03/03/2019   ALKPHOS 61 03/03/2019   BILITOT 0.2 03/03/2019   Lab Results  Component Value Date   HGBA1C 5.8 (H) 03/03/2019   Lab Results  Component Value Date   INSULIN 24.3 03/03/2019   Lab Results  Component Value Date   TSH 1.422 01/31/2012   Lab Results  Component Value Date   CHOL 172 12/25/2018   TRIG 70 12/25/2018   Lab Results  Component Value Date   WBC 10.7 (H) 04/30/2018   HGB 12.8 12/25/2018   HCT 38 12/25/2018   MCV 89.3 04/30/2018   PLT 211 12/25/2018   No results found for: IRON, TIBC, FERRITIN  Attestation Statements:   Reviewed by clinician on day of visit: allergies, medications, problem list, medical history, surgical history, family history, social history, and previous encounter notes.  Time spent on visit including pre-visit chart review and post-visit charting and care was 20 minutes.   02/24/2019, am acting as Olivia Simpson for Energy manager, DO   I have reviewed the above documentation for accuracy and completeness, and I agree with the above. [employer], DO

## 2019-06-17 ENCOUNTER — Encounter (INDEPENDENT_AMBULATORY_CARE_PROVIDER_SITE_OTHER): Payer: Self-pay | Admitting: Bariatrics

## 2019-07-02 ENCOUNTER — Encounter (INDEPENDENT_AMBULATORY_CARE_PROVIDER_SITE_OTHER): Payer: Self-pay | Admitting: Bariatrics

## 2019-07-02 ENCOUNTER — Other Ambulatory Visit: Payer: Self-pay

## 2019-07-02 ENCOUNTER — Ambulatory Visit (INDEPENDENT_AMBULATORY_CARE_PROVIDER_SITE_OTHER): Payer: Medicare HMO | Admitting: Bariatrics

## 2019-07-02 VITALS — BP 102/66 | HR 62 | Temp 98.4°F | Ht 62.0 in | Wt 225.0 lb

## 2019-07-02 DIAGNOSIS — R7303 Prediabetes: Secondary | ICD-10-CM

## 2019-07-02 DIAGNOSIS — Z6841 Body Mass Index (BMI) 40.0 and over, adult: Secondary | ICD-10-CM | POA: Diagnosis not present

## 2019-07-02 DIAGNOSIS — E559 Vitamin D deficiency, unspecified: Secondary | ICD-10-CM | POA: Diagnosis not present

## 2019-07-02 NOTE — Progress Notes (Signed)
Chief Complaint:   OBESITY Olivia Simpson is here to discuss her progress with her obesity treatment plan along with follow-up of her obesity related diagnoses. Olivia Simpson is on the Category 3 Plan and states she is following her eating plan approximately 70% of the time. Olivia Simpson states she is exercising 0 minutes 0 times per week.  Today's visit was #: 10 Starting weight: 237 lbs Starting date: 01/26/2019 Today's weight: 225 lbs Today's date: 07/02/2019 Total lbs lost to date: 12 Total lbs lost since last in-office visit: 1  Interim History: Olivia Simpson is down an additional 1 lb since her last visit. She did eat some pasta over the last few weeks. She reports getting adequate water intake.  Subjective:   Prediabetes. Olivia Simpson has a diagnosis of prediabetes based on her elevated HgA1c and was informed this puts her at greater risk of developing diabetes. She continues to work on diet and exercise to decrease her risk of diabetes. She denies nausea or hypoglycemia. Olivia Simpson is on no medications.  Lab Results  Component Value Date   HGBA1C 5.8 (H) 03/03/2019   Lab Results  Component Value Date   INSULIN 24.3 03/03/2019   Vitamin D deficiency. Olivia Simpson is taking Vitamin D. Last Vitamin D 39.6 on 03/03/2019.  Assessment/Plan:   Prediabetes. Olivia Simpson will continue to work on weight loss, exercise, increasing healthy fats and protein, and decreasing simple carbohydrates to help decrease the risk of diabetes.   Vitamin D deficiency. Low Vitamin D level contributes to fatigue and are associated with obesity, breast, and colon cancer. She agrees to continue to take Vitamin D and will follow-up for routine testing of Vitamin D, at least 2-3 times per year to avoid over-replacement.  Class 3 severe obesity with serious comorbidity and body mass index (BMI) of 40.0 to 44.9 in adult, unspecified obesity type (HCC).  Olivia Simpson is currently in the action stage of change. As such, her goal is to  continue with weight loss efforts. She has agreed to the Category 3 Plan.   She will work on mindful eating, meal planning, increasing her portion size and weighing her meat.  Exercise goals: Olivia Simpson has been more active (chair exercises). She has a brace on the right knee.  Behavioral modification strategies: increasing lean protein intake, decreasing simple carbohydrates, increasing vegetables, increasing water intake, decreasing eating out, no skipping meals, meal planning and cooking strategies, keeping healthy foods in the home, ways to avoid boredom eating, ways to avoid night time snacking, better snacking choices and emotional eating strategies.  Olivia Simpson has agreed to follow-up with our clinic in 2 weeks. She was informed of the importance of frequent follow-up visits to maximize her success with intensive lifestyle modifications for her multiple health conditions.   Objective:   Blood pressure 102/66, pulse 62, temperature 98.4 F (36.9 C), height 5\' 2"  (1.575 m), weight 225 lb (102.1 kg), SpO2 99 %. Body mass index is 41.15 kg/m.  General: Cooperative, alert, well developed, in no acute distress. HEENT: Conjunctivae and lids unremarkable. Cardiovascular: Regular rhythm.  Lungs: Normal work of breathing. Neurologic: No focal deficits.   Lab Results  Component Value Date   CREATININE 0.91 03/03/2019   BUN 17 03/03/2019   NA 141 03/03/2019   K 3.9 03/03/2019   CL 103 03/03/2019   CO2 27 03/03/2019   Lab Results  Component Value Date   ALT 11 03/03/2019   AST 15 03/03/2019   ALKPHOS 61 03/03/2019   BILITOT 0.2 03/03/2019  Lab Results  Component Value Date   HGBA1C 5.8 (H) 03/03/2019   Lab Results  Component Value Date   INSULIN 24.3 03/03/2019   Lab Results  Component Value Date   TSH 1.422 01/31/2012   Lab Results  Component Value Date   CHOL 172 12/25/2018   TRIG 70 12/25/2018   Lab Results  Component Value Date   WBC 10.7 (H) 04/30/2018   HGB 12.8  12/25/2018   HCT 38 12/25/2018   MCV 89.3 04/30/2018   PLT 211 12/25/2018   No results found for: IRON, TIBC, FERRITIN  Obesity Behavioral Intervention Documentation for Insurance:   Approximately 15 minutes were spent on the discussion below.  ASK: We discussed the diagnosis of obesity with Olivia Simpson today and Olivia Simpson agreed to give [name] permission to discuss obesity behavioral modification therapy today.  ASSESS: Olivia Simpson has the diagnosis of obesity and her BMI today is 41.3. Olivia Simpson is in the action stage of change.   ADVISE: Olivia Simpson was educated on the multiple health risks of obesity as well as the benefit of weight loss to improve her health. She was advised of the need for long term treatment and the importance of lifestyle modifications to improve her current health and to decrease her risk of future health problems.  AGREE: Multiple dietary modification options and treatment options were discussed and Olivia Simpson agreed to follow the recommendations documented in the above note.  ARRANGE: Olivia Simpson was educated on the importance of frequent visits to treat obesity as outlined per CMS and USPSTF guidelines and agreed to schedule her next follow up appointment today.  Attestation Statements:   Reviewed by clinician on day of visit: allergies, medications, problem list, medical history, surgical history, family history, social history, and previous encounter notes.  Olivia Simpson, am acting as Location manager for [employer], DO   I have reviewed the above documentation for accuracy and completeness, and I agree with the above. Olivia Lesch, DO

## 2019-07-16 ENCOUNTER — Other Ambulatory Visit (INDEPENDENT_AMBULATORY_CARE_PROVIDER_SITE_OTHER): Payer: Self-pay | Admitting: Bariatrics

## 2019-07-16 DIAGNOSIS — E559 Vitamin D deficiency, unspecified: Secondary | ICD-10-CM

## 2019-07-21 ENCOUNTER — Other Ambulatory Visit: Payer: Self-pay

## 2019-07-21 ENCOUNTER — Ambulatory Visit (INDEPENDENT_AMBULATORY_CARE_PROVIDER_SITE_OTHER): Payer: Medicare HMO | Admitting: Bariatrics

## 2019-07-21 ENCOUNTER — Encounter (INDEPENDENT_AMBULATORY_CARE_PROVIDER_SITE_OTHER): Payer: Self-pay | Admitting: Bariatrics

## 2019-07-21 VITALS — BP 111/71 | HR 62 | Temp 97.8°F | Ht 62.0 in | Wt 227.0 lb

## 2019-07-21 DIAGNOSIS — K219 Gastro-esophageal reflux disease without esophagitis: Secondary | ICD-10-CM

## 2019-07-21 DIAGNOSIS — E559 Vitamin D deficiency, unspecified: Secondary | ICD-10-CM

## 2019-07-21 DIAGNOSIS — I10 Essential (primary) hypertension: Secondary | ICD-10-CM | POA: Diagnosis not present

## 2019-07-21 DIAGNOSIS — Z6841 Body Mass Index (BMI) 40.0 and over, adult: Secondary | ICD-10-CM | POA: Diagnosis not present

## 2019-07-21 MED ORDER — VITAMIN D (ERGOCALCIFEROL) 1.25 MG (50000 UNIT) PO CAPS: 50000.0000 [IU] | ORAL_CAPSULE | ORAL | 0 refills | Status: DC

## 2019-07-21 NOTE — Progress Notes (Signed)
Chief Complaint:   OBESITY Olivia Simpson is here to discuss her progress with her obesity treatment plan along with follow-up of her obesity related diagnoses. Olivia Simpson is on the Category 3 Plan and states she is following her eating plan approximately 60% of the time. Olivia Simpson states she is exercising 0 minutes 0 times per week.  Today's visit was #: 11 Starting weight: 237 lbs Starting date: 01/26/2019 Today's weight: 227 lbs Today's date: 07/21/2019 Total lbs lost to date: 10 Total lbs lost since last in-office visit: 0  Interim History: Olivia Simpson is up 2 lbs. Her right knee has been bothering her. She is up 3.6 lbs of water per the bioimpedance scale. She reports drinking some water.  Subjective:   Essential hypertension. Olivia Simpson is taking lisinopril. Blood pressure is controlled. No lightheadedness.  BP Readings from Last 3 Encounters:  07/02/19 102/66  06/16/19 101/70  05/28/19 115/74   Lab Results  Component Value Date   CREATININE 0.91 03/03/2019   CREATININE 0.9 12/25/2018   CREATININE 0.88 04/30/2018   Vitamin D deficiency. No nausea, vomiting, or muscle weakness. Last Vitamin D 39.6 on 03/03/2019.  Gastroesophageal reflux disease, unspecified whether esophagitis present. [name] states she has medication at home, but stopped it.  Assessment/Plan:   Essential hypertension. Olivia Simpson is working on healthy weight loss and exercise to improve blood pressure control. We will watch for signs of hypotension as she continues her lifestyle modifications. She will continue her medication as directed, decrease sodium in her diet, and increase her water intake.  Vitamin D deficiency. Low Vitamin D level contributes to fatigue and are associated with obesity, breast, and colon cancer. She was given a prescription for Vitamin D, Ergocalciferol, (DRISDOL) 1.25 MG (50000 UNIT) CAPS capsule every week #4 with 0 refills and will follow-up for routine testing of Vitamin D, at least  2-3 times per year to avoid over-replacement.     Gastroesophageal reflux disease, unspecified whether esophagitis present. Intensive lifestyle modifications are the first line treatment for this issue. We discussed several lifestyle modifications today and she will continue to work on diet, exercise and weight loss efforts. Orders and follow up as documented in patient record. [name] will resume Prilosec - has prescription at home.  Counseling  If a person has gastroesophageal reflux disease (GERD), food and stomach acid move back up into the esophagus and cause symptoms or problems such as damage to the esophagus.  Anti-reflux measures include: raising the head of the bed, avoiding tight clothing or belts, avoiding eating late at night, not lying down shortly after mealtime, and achieving weight loss.  Avoid ASA, NSAID's, caffeine, alcohol, and tobacco.   OTC Pepcid and/or Tums are often very helpful for as needed use.   However, for persisting chronic or daily symptoms, stronger medications like Omeprazole may be needed.  You may need to avoid foods and drinks such as: ? Coffee and tea (with or without caffeine). ? Drinks that contain alcohol. ? Energy drinks and sports drinks. ? Bubbly (carbonated) drinks or sodas. ? Chocolate and cocoa. ? Peppermint and mint flavorings. ? Garlic and onions. ? Horseradish. ? Spicy and acidic foods. These include peppers, chili powder, curry powder, vinegar, hot sauces, and BBQ sauce. ? Citrus fruit juices and citrus fruits, such as oranges, lemons, and limes. ? Tomato-based foods. These include red sauce, chili, salsa, and pizza with red sauce. ? Fried and fatty foods. These include donuts, french fries, potato chips, and high-fat dressings. ? High-fat meats.  These include hot dogs, rib eye steak, sausage, ham, and bacon.  Class 3 severe obesity with serious comorbidity and body mass index (BMI) of 40.0 to 44.9 in adult, unspecified obesity type  ([place]).  Olivia Simpson is currently in the action stage of change. As such, her goal is to continue with weight loss efforts. She has agreed to the Category 3 Plan.   She will work on meal planning, mindful eating, increasing her water intake, and being more adherent to the plan.  Exercise goals: Older adults should follow the adult guidelines. When older adults cannot meet the adult guidelines, they should be as physically active as their abilities and conditions will allow.   Behavioral modification strategies: increasing lean protein intake, decreasing simple carbohydrates, increasing vegetables, increasing water intake, decreasing eating out, no skipping meals, meal planning and cooking strategies, keeping healthy foods in the home and planning for success.  Olivia Simpson has agreed to follow-up with our clinic in 2 weeks. She was informed of the importance of frequent follow-up visits to maximize her success with intensive lifestyle modifications for her multiple health conditions.   Objective:   Pulse 62, temperature 97.8 F (36.6 C), height 5\' 2"  (1.575 m), weight 227 lb (103 kg), SpO2 98 %. Body mass index is 41.52 kg/m.  General: Cooperative, alert, well developed, in no acute distress. HEENT: Conjunctivae and lids unremarkable. Cardiovascular: Regular rhythm.  Lungs: Normal work of breathing. Neurologic: No focal deficits.   Lab Results  Component Value Date   CREATININE 0.91 03/03/2019   BUN 17 03/03/2019   NA 141 03/03/2019   K 3.9 03/03/2019   CL 103 03/03/2019   CO2 27 03/03/2019   Lab Results  Component Value Date   ALT 11 03/03/2019   AST 15 03/03/2019   ALKPHOS 61 03/03/2019   BILITOT 0.2 03/03/2019   Lab Results  Component Value Date   HGBA1C 5.8 (H) 03/03/2019   Lab Results  Component Value Date   INSULIN 24.3 03/03/2019   Lab Results  Component Value Date   TSH 1.422 01/31/2012   Lab Results  Component Value Date   CHOL 172 12/25/2018   TRIG 70  12/25/2018   Lab Results  Component Value Date   WBC 10.7 (H) 04/30/2018   HGB 12.8 12/25/2018   HCT 38 12/25/2018   MCV 89.3 04/30/2018   PLT 211 12/25/2018   No results found for: IRON, TIBC, FERRITIN  Obesity Behavioral Intervention Documentation for Insurance:   Approximately 15 minutes were spent on the discussion below.  ASK: We discussed the diagnosis of obesity with Korinna today and Mersadies agreed to give [name] permission to discuss obesity behavioral modification therapy today.  ASSESS: Kimberely has the diagnosis of obesity and her BMI today is 41.6. Roshell is in the action stage of change.   ADVISE: Jenene was educated on the multiple health risks of obesity as well as the benefit of weight loss to improve her health. She was advised of the need for long term treatment and the importance of lifestyle modifications to improve her current health and to decrease her risk of future health problems.  AGREE: Multiple dietary modification options and treatment options were discussed and Aylen agreed to follow the recommendations documented in the above note.  ARRANGE: [place] was educated on the importance of frequent visits to treat obesity as outlined per CMS and USPSTF guidelines and agreed to schedule her next follow up appointment today.  Attestation Statements:   Reviewed by clinician on day  of visit: allergies, medications, problem list, medical history, surgical history, family history, social history, and previous encounter notes.  Fernanda Drum, am acting as Energy manager for [employer], DO   I have reviewed the above documentation for accuracy and completeness, and I agree with the above. Corinna Capra, DO

## 2019-08-06 ENCOUNTER — Ambulatory Visit (INDEPENDENT_AMBULATORY_CARE_PROVIDER_SITE_OTHER): Payer: Medicare HMO | Admitting: Bariatrics

## 2019-08-06 ENCOUNTER — Other Ambulatory Visit: Payer: Self-pay

## 2019-08-06 ENCOUNTER — Encounter (INDEPENDENT_AMBULATORY_CARE_PROVIDER_SITE_OTHER): Payer: Self-pay | Admitting: Bariatrics

## 2019-08-06 VITALS — BP 119/78 | HR 73 | Temp 97.8°F | Ht 62.0 in | Wt 227.0 lb

## 2019-08-06 DIAGNOSIS — I10 Essential (primary) hypertension: Secondary | ICD-10-CM

## 2019-08-06 DIAGNOSIS — E559 Vitamin D deficiency, unspecified: Secondary | ICD-10-CM

## 2019-08-06 DIAGNOSIS — Z6841 Body Mass Index (BMI) 40.0 and over, adult: Secondary | ICD-10-CM

## 2019-08-06 MED ORDER — VITAMIN D (ERGOCALCIFEROL) 1.25 MG (50000 UNIT) PO CAPS: 50000.0000 [IU] | ORAL_CAPSULE | ORAL | 0 refills | Status: AC

## 2019-08-06 NOTE — Progress Notes (Signed)
Chief Complaint:   OBESITY Olivia Simpson is here to discuss her progress with her obesity treatment plan along with follow-up of her obesity related diagnoses. Olivia Simpson is on the Category 3 Plan and states she is following her eating plan approximately 60% of the time. Olivia Simpson states she is exercising 0 minutes 0 times per week.  Today's visit was #: 12 Starting weight: 237 lbs Starting date: 01/26/2019 Today's weight: 227 lbs Today's date: 08/06/2019 Total lbs lost to date: 10 Total lbs lost since last in-office visit: 0  Interim History: Olivia Simpson's weight remains the same. She states it is difficult to walk for exercise due to knee pain and back pain. She wants to get a knee replacement, but she need to lower her BMI. She reports drinking five 12-oz bottles of water a day. She is not getting all of her protein. No snacking. Is sometimes skipping lunch. Denies hunger.  Subjective:   Vitamin D deficiency. Olivia Simpson is taking Vitamin D. Last Vitamin D 39.6 on 03/03/2019.  Essential hypertension. Olivia Simpson is taking lisinopril. Blood pressure is controlled.  BP Readings from Last 3 Encounters:  08/06/19 119/78  07/21/19 111/71  07/02/19 102/66   Lab Results  Component Value Date   CREATININE 0.91 03/03/2019   CREATININE 0.9 12/25/2018   CREATININE 0.88 04/30/2018   Assessment/Plan:   Vitamin D deficiency. Low Vitamin D level contributes to fatigue and are associated with obesity, breast, and colon cancer. She was given a refill on her Vitamin D, Ergocalciferol, (DRISDOL) 1.25 MG (50000 UNIT) CAPS capsule every week #4 with 0 refills and will follow-up for routine testing of Vitamin D, at least 2-3 times per year to avoid over-replacement.    Essential hypertension. Olivia Simpson is working on healthy weight loss and exercise to improve blood pressure control. We will watch for signs of hypotension as she continues her lifestyle modifications. She will continue her medication as  directed.  Class 3 severe obesity with serious comorbidity and body mass index (BMI) of 40.0 to 44.9 in adult, unspecified obesity type ([facility]).  Olivia Simpson is currently in the action stage of change. As such, her goal is to continue with weight loss efforts. She has agreed to the Category 3 Plan.   She will work on meal planning.  Exercise goals: No exercise has been prescribed at this time.  Behavioral modification strategies: increasing lean protein intake, decreasing simple carbohydrates, increasing vegetables, increasing water intake, decreasing eating out, no skipping meals, meal planning and cooking strategies and keeping healthy foods in the home.  Olivia Simpson has agreed to follow-up with our clinic in 2 weeks. She was informed of the importance of frequent follow-up visits to maximize her success with intensive lifestyle modifications for her multiple health conditions.   Objective:   Blood pressure 119/78, pulse 73, temperature 97.8 F (36.6 C), height 5\' 2"  (1.575 m), weight 227 lb (103 kg), SpO2 97 %. Body mass index is 41.52 kg/m.  General: Cooperative, alert, well developed, in no acute distress. HEENT: Conjunctivae and lids unremarkable. Cardiovascular: Regular rhythm.  Lungs: Normal work of breathing. Neurologic: No focal deficits.   Lab Results  Component Value Date   CREATININE 0.91 03/03/2019   BUN 17 03/03/2019   NA 141 03/03/2019   K 3.9 03/03/2019   CL 103 03/03/2019   CO2 27 03/03/2019   Lab Results  Component Value Date   ALT 11 03/03/2019   AST 15 03/03/2019   ALKPHOS 61 03/03/2019   BILITOT 0.2 03/03/2019  Lab Results  Component Value Date   HGBA1C 5.8 (H) 03/03/2019   Lab Results  Component Value Date   INSULIN 24.3 03/03/2019   Lab Results  Component Value Date   TSH 1.422 01/31/2012   Lab Results  Component Value Date   CHOL 172 12/25/2018   TRIG 70 12/25/2018   Lab Results  Component Value Date   WBC 10.7 (H) 04/30/2018   HGB 12.8  12/25/2018   HCT 38 12/25/2018   MCV 89.3 04/30/2018   PLT 211 12/25/2018   No results found for: IRON, TIBC, FERRITIN  Obesity Behavioral Intervention Documentation for Insurance:   Approximately 15 minutes were spent on the discussion below.  ASK: We discussed the diagnosis of obesity with Olivia Simpson today and Olivia Simpson agreed to give [name] permission to discuss obesity behavioral modification therapy today.  ASSESS: Olivia Simpson has the diagnosis of obesity and her BMI today is 41.7. Olivia Simpson is in the action stage of change.   ADVISE: Olivia Simpson was educated on the multiple health risks of obesity as well as the benefit of weight loss to improve her health. She was advised of the need for long term treatment and the importance of lifestyle modifications to improve her current health and to decrease her risk of future health problems.  AGREE: Multiple dietary modification options and treatment options were discussed and Olivia Simpson agreed to follow the recommendations documented in the above note.  ARRANGE: Olivia Simpson was educated on the importance of frequent visits to treat obesity as outlined per CMS and USPSTF guidelines and agreed to schedule her next follow up appointment today.  Attestation Statements:   Reviewed by clinician on day of visit: allergies, medications, problem list, medical history, surgical history, family history, social history, and previous encounter notes.  [name], am acting as Location manager for [employer], DO   I have reviewed the above documentation for accuracy and completeness, and I agree with the above. Jearld Lesch, DO

## 2019-08-10 ENCOUNTER — Encounter (INDEPENDENT_AMBULATORY_CARE_PROVIDER_SITE_OTHER): Payer: Self-pay | Admitting: Bariatrics

## 2019-08-27 ENCOUNTER — Ambulatory Visit (INDEPENDENT_AMBULATORY_CARE_PROVIDER_SITE_OTHER): Payer: Medicare HMO | Admitting: Bariatrics

## 2019-09-01 ENCOUNTER — Ambulatory Visit (INDEPENDENT_AMBULATORY_CARE_PROVIDER_SITE_OTHER): Payer: Medicare HMO | Admitting: Bariatrics

## 2019-09-09 ENCOUNTER — Other Ambulatory Visit (HOSPITAL_COMMUNITY): Payer: Self-pay | Admitting: Orthopedic Surgery

## 2019-09-09 ENCOUNTER — Other Ambulatory Visit: Payer: Self-pay | Admitting: Orthopedic Surgery

## 2019-09-09 DIAGNOSIS — R2231 Localized swelling, mass and lump, right upper limb: Secondary | ICD-10-CM

## 2019-09-29 ENCOUNTER — Ambulatory Visit (HOSPITAL_COMMUNITY): Payer: Medicare HMO

## 2019-09-29 ENCOUNTER — Encounter (HOSPITAL_COMMUNITY): Payer: Self-pay

## 2019-12-22 ENCOUNTER — Other Ambulatory Visit: Payer: Self-pay

## 2019-12-22 ENCOUNTER — Encounter (HOSPITAL_COMMUNITY): Payer: Self-pay | Admitting: Emergency Medicine

## 2019-12-22 ENCOUNTER — Emergency Department (HOSPITAL_COMMUNITY)
Admission: EM | Admit: 2019-12-22 | Discharge: 2019-12-23 | Disposition: A | Discharge status: Home / Self Care | Payer: Medicare HMO | Attending: Emergency Medicine | Admitting: Emergency Medicine

## 2019-12-22 ENCOUNTER — Emergency Department (HOSPITAL_COMMUNITY): Payer: Medicare HMO

## 2019-12-22 DIAGNOSIS — Z7982 Long term (current) use of aspirin: Secondary | ICD-10-CM | POA: Insufficient documentation

## 2019-12-22 DIAGNOSIS — I10 Essential (primary) hypertension: Secondary | ICD-10-CM | POA: Diagnosis not present

## 2019-12-22 DIAGNOSIS — R05 Cough: Secondary | ICD-10-CM | POA: Diagnosis present

## 2019-12-22 DIAGNOSIS — U071 COVID-19: Secondary | ICD-10-CM | POA: Diagnosis not present

## 2019-12-22 DIAGNOSIS — Z79899 Other long term (current) drug therapy: Secondary | ICD-10-CM | POA: Diagnosis not present

## 2019-12-22 LAB — BASIC METABOLIC PANEL
Anion gap: 13 (ref 5–15)
BUN: 19 mg/dL (ref 8–23)
CO2: 20 mmol/L — ABNORMAL LOW (ref 22–32)
Calcium: 9.2 mg/dL (ref 8.9–10.3)
Chloride: 105 mmol/L (ref 98–111)
Creatinine, Ser: 0.88 mg/dL (ref 0.44–1.00)
GFR calc Af Amer: >60 mL/min (ref >60)
GFR calc non Af Amer: >60 mL/min (ref >60)
Glucose, Bld: 114 mg/dL — ABNORMAL HIGH (ref 70–99)
Potassium: 3.7 mmol/L (ref 3.5–5.1)
Sodium: 138 mmol/L (ref 135–145)

## 2019-12-22 LAB — CBC
HCT: 40.8 % (ref 36.0–46.0)
Hemoglobin: 13.3 g/dL (ref 12.0–15.0)
MCH: 28.5 pg (ref 26.0–34.0)
MCHC: 32.6 g/dL (ref 30.0–36.0)
MCV: 87.4 fL (ref 80.0–100.0)
Platelets: 257 10*3/uL (ref 150–400)
RBC: 4.67 MIL/uL (ref 3.87–5.11)
RDW: 14.2 % (ref 11.5–15.5)
WBC: 11.6 10*3/uL — ABNORMAL HIGH (ref 4.0–10.5)
nRBC: 0 % (ref 0.0–0.2)

## 2019-12-22 NOTE — ED Triage Notes (Signed)
Pt reports a positive and negative covid test. Pt was sent due to her home O2 reading in the 70s at times. Pt states she took the nail polish off and it read normal. Denies SOB. States that when she lays down at night, she can tell she is breathing heavier.

## 2019-12-23 ENCOUNTER — Encounter (HOSPITAL_COMMUNITY): Payer: Self-pay | Admitting: Emergency Medicine

## 2019-12-23 MED ORDER — ALUM & MAG HYDROXIDE-SIMETH 200-200-20 MG/5ML PO SUSP
30.0000 mL | Freq: Once | ORAL | Status: AC
  Administered 2019-12-23: 30 mL via ORAL
  Filled 2019-12-23: qty 30

## 2019-12-23 NOTE — ED Notes (Signed)
Ambulated pt within the room. O2 sat at 98%, strong gait, no complaints of SOB.

## 2019-12-23 NOTE — ED Provider Notes (Signed)
[facility] EMERGENCY DEPARTMENT Provider Note   CSN: [id number] Arrival date & time: 12/22/19  1423     History Chief Complaint  Patient presents with  . Covid Positive    Olivia Simpson is a 69 y.o. female.  The history is provided by the patient.  URI Presenting symptoms: congestion and cough   Presenting symptoms: no fever   Severity:  Mild Onset quality:  Gradual Duration:  10 days Timing:  Constant Progression:  Unchanged Chronicity:  New Relieved by:  Nothing Worsened by:  Nothing Ineffective treatments: steroids and other medications prescribed by the family doctor  Associated symptoms: headaches   Associated symptoms: no arthralgias   Risk factors: being elderly   Patient presents with complaints of covid.  She states she had a negative saliva test and then a positive nasal swab and has had about 10 days of symptoms.  No CP.  No DOE.  Thought she has low oxygen at home and came in for evaluation but it turned out the oximeter would not read secondary to thick sparkly nail polish.      Past Medical History:  Diagnosis Date  . Arthritis of knee   . Back pain   . Edema, lower extremity   . Gallbladder problem   . Hypertension   . IBS (irritable bowel syndrome)   . Joint pain   . Lumbar stenosis   . Snoring   . Vitamin D deficiency   . Vitamin D deficiency     Patient Active Problem List   Diagnosis Date Noted  . Class 3 severe obesity with serious comorbidity and body mass index (BMI) of 40.0 to 44.9 in adult [facility]) 03/04/2019  . Prediabetes 03/04/2019  . Hypertension   . Vitamin D deficiency   . Snoring     Past Surgical History:  Procedure Laterality Date  . BREAST EXCISIONAL BIOPSY Right   . BREAST LUMPECTOMY  1971  . BUNIONECTOMY Right 2005  . CHOLECYSTECTOMY N/A 04/28/2018   Procedure: LAPAROSCOPIC CHOLECYSTECTOMY WITH INTRAOPERATIVE CHOLANGIOGRAM;  Surgeon: Manus Rudd, MD;  Location: [facility] OR;  Service: General;   Laterality: N/A;  . hammertoe removal  03/20/13  . ORTHOPEDIC SURGERY Left   . ORTHOPEDIC SURGERY Right   . PARTIAL HYSTERECTOMY  2002     OB History    Gravida  2   Para  1   Term      Preterm      AB  1   Living  1     SAB  1   TAB      Ectopic      Multiple      Live Births              Family History  Problem Relation Age of Onset  . Colon cancer Father   . High blood pressure Father   . Pancreatic cancer Mother   . Diabetes Mother   . High blood pressure Mother   . Diabetes Brother   . Diabetes Sister     Social History   Tobacco Use  . Smoking status: Never Smoker  . Smokeless tobacco: Never Used  Substance Use Topics  . Alcohol use: No  . Drug use: No    Home Medications Prior to Admission medications   Medication Sig Start Date End Date Taking? Authorizing Provider  aspirin 81 MG tablet Take 81 mg by mouth daily.    [provider]  cholecalciferol (VITAMIN D3) 25 MCG (1000  UT) tablet Take 1,000 Units by mouth daily.    [provider]  fesoterodine (TOVIAZ) 4 MG TB24 tablet Take 4 mg by mouth daily.    [provider]  gabapentin (NEURONTIN) 300 MG capsule Take 300 mg by mouth 3 (three) times daily.    [provider]  ibuprofen (ADVIL,MOTRIN) 600 MG tablet Take 600 mg by mouth every 6 (six) hours as needed for headache or mild pain.    [provider]  lisinopril (ZESTRIL) 10 MG tablet Take 10 mg by mouth daily.    [provider]  Melatonin 5 MG CAPS Take by mouth daily.    [provider]  Multiple Vitamin (MULTIVITAMIN) capsule Take 1 capsule by mouth daily.    [provider]  omeprazole (PRILOSEC) 20 MG capsule Take 20 mg by mouth daily.    [provider]  Vitamin D, Ergocalciferol, (DRISDOL) 1.25 MG (50000 UNIT) CAPS capsule Take 1 capsule (50,000 Units total) by mouth every 7 (seven) days. 08/06/19   Corinna Capra A, DO    Allergies    Sulfa  antibiotics and Codeine  Review of Systems   Review of Systems  Constitutional: Negative for fever.  HENT: Positive for congestion.   Eyes: Negative for visual disturbance.  Respiratory: Positive for cough.   Cardiovascular: Negative for chest pain, palpitations and leg swelling.  Gastrointestinal: Negative for abdominal pain.  Genitourinary: Negative for difficulty urinating.  Musculoskeletal: Negative for arthralgias.  Neurological: Positive for headaches.  Psychiatric/Behavioral: Negative for agitation.  All other systems reviewed and are negative.   Physical Exam Updated Vital Signs BP 138/76 (BP Location: Left Arm)   Pulse 77   Temp 97.7 F (36.5 C) (Oral)   Resp 20   SpO2 98%   Physical Exam Vitals and nursing note reviewed.  Constitutional:      General: She is not in acute distress.    Appearance: Normal appearance.  HENT:     Head: Normocephalic and atraumatic.     Nose: Nose normal.  Eyes:     Conjunctiva/sclera: Conjunctivae normal.     Pupils: Pupils are equal, round, and reactive to light.  Cardiovascular:     Rate and Rhythm: Normal rate and regular rhythm.     Pulses: Normal pulses.     Heart sounds: Normal heart sounds.  Pulmonary:     Effort: Pulmonary effort is normal.     Breath sounds: Normal breath sounds.  Abdominal:     General: Abdomen is flat. Bowel sounds are normal.     Palpations: Abdomen is soft.     Tenderness: There is no abdominal tenderness. There is no guarding.  Musculoskeletal:        General: Normal range of motion.     Cervical back: Normal range of motion and neck supple.  Skin:    General: Skin is warm and dry.     Capillary Refill: Capillary refill takes less than 2 seconds.  Neurological:     General: No focal deficit present.     Mental Status: She is alert and oriented to person, place, and time.     Deep Tendon Reflexes: Reflexes normal.  Psychiatric:        Mood and Affect: Mood normal.        Behavior: Behavior  normal.     ED Results / Procedures / Treatments   Labs (all labs ordered are listed, but only abnormal results are displayed) Labs Reviewed  CBC - Abnormal; Notable for  the following components:      Result Value   WBC 11.6 (*)    All other components within normal limits  BASIC METABOLIC PANEL - Abnormal; Notable for the following components:   CO2 20 (*)    Glucose, Bld 114 (*)    All other components within normal limits    EKG None  Radiology DG Chest 2 View  Result Date: 12/22/2019 CLINICAL DATA:  Chest pressure, COVID-19 positive EXAM: CHEST - 2 VIEW COMPARISON:  03/28/2017 FINDINGS: Frontal and lateral views of the chest demonstrate an unremarkable cardiac silhouette. Stable ectasia of the thoracic aorta. No airspace disease, effusion, or pneumothorax. No acute bony abnormalities. IMPRESSION: 1. Stable exam, no acute process. Electronically Signed   By: Sharlet Salina M.D.   On: 12/22/2019 17:18    Procedures Procedures (including critical care time)  Medications Ordered in ED Medications - No data to display  ED Course  I have reviewed the triage vital signs and the nursing notes.  Pertinent labs & imaging results that were available during my care of the patient were reviewed by me and considered in my medical decision making (see chart for details).    Well appearing ambulated in the room with saturation of 98% on room air.  The home reading was spurious and due to patient's nail polish which was removed.  Advised monitoring home oxygen levels and close outpatient follow up.    Olivia Simpson was evaluated in Emergency Department on 12/23/2019 for the symptoms described in the history of present illness. She was evaluated in the context of the global COVID-19 pandemic, which necessitated consideration that the patient might be at risk for infection with the SARS-CoV-2 virus that causes COVID-19. Institutional protocols and algorithms that pertain to the evaluation  of patients at risk for COVID-19 are in a state of rapid change based on information released by regulatory bodies including the CDC and federal and state organizations. These policies and algorithms were followed during the patient's care in the ED.  Final Clinical Impression(s) / ED Diagnoses Return for intractable cough, coughing up blood,fevers >100.4 unrelieved by medication, shortness of breath, intractable vomiting, chest pain, shortness of breath, weakness,numbness, changes in speech, facial asymmetry,abdominal pain, passing out,Inability to tolerate liquids or food, cough, altered mental status or any concerns. No signs of systemic illness or infection. The patient is nontoxic-appearing on exam and vital signs are within normal limits.   I have reviewed the triage vital signs and the nursing notes. Pertinent labs &imaging results that were available during my care of the patient were reviewed by me and considered in my medical decision making (see chart for details).After history, exam, and medical workup I feel the patient has beenappropriately medically screened and is safe for discharge home. Pertinent diagnoses were discussed with the patient. Patient was given return precautions.   Raji Glinski, MD 12/23/19 [phone]

## 2020-02-26 ENCOUNTER — Ambulatory Visit: Payer: Medicare HMO

## 2020-03-04 ENCOUNTER — Ambulatory Visit: Payer: Medicare HMO | Attending: Internal Medicine

## 2020-03-04 DIAGNOSIS — Z23 Encounter for immunization: Secondary | ICD-10-CM

## 2020-03-04 NOTE — Progress Notes (Addendum)
   Covid-19 Vaccination Clinic  Name:  OVEDA DADAMO    MRN: [id number] DOB: [birth year]  03/04/2020  Ms. Tursi was observed post Covid-19 immunization for 30 minutes without incident. She was provided with Vaccine Information Sheet and instruction to access the V-Safe system.   Ms. Bussie was instructed to call 911 with any severe reactions post vaccine: [name] Difficulty breathing  . Swelling of face and throat  . A fast heartbeat  . A bad rash all over body  . Dizziness and weakness   Immunizations Administered    Name Date Dose VIS Date Route   Pfizer COVID-19 Vaccine 03/04/2020  1:19 PM 0.3 mL 02/03/2020 Intramuscular   Manufacturer: [employer], [employer]   Lot: [id number]   NDC: [address]-5

## 2020-04-01 ENCOUNTER — Other Ambulatory Visit: Payer: Self-pay | Admitting: Family

## 2020-04-01 DIAGNOSIS — R5381 Other malaise: Secondary | ICD-10-CM

## 2020-04-05 ENCOUNTER — Other Ambulatory Visit: Payer: Self-pay | Admitting: Family

## 2020-04-05 DIAGNOSIS — Z1231 Encounter for screening mammogram for malignant neoplasm of breast: Secondary | ICD-10-CM

## 2020-04-05 DIAGNOSIS — E2839 Other primary ovarian failure: Secondary | ICD-10-CM

## 2020-07-15 ENCOUNTER — Other Ambulatory Visit: Payer: Medicare HMO

## 2020-08-11 DIAGNOSIS — D179 Benign lipomatous neoplasm, unspecified: Secondary | ICD-10-CM | POA: Insufficient documentation

## 2020-09-05 ENCOUNTER — Other Ambulatory Visit (HOSPITAL_BASED_OUTPATIENT_CLINIC_OR_DEPARTMENT_OTHER): Payer: Self-pay | Admitting: Orthopedic Surgery

## 2020-09-05 DIAGNOSIS — K7689 Other specified diseases of liver: Secondary | ICD-10-CM

## 2020-09-07 ENCOUNTER — Encounter (HOSPITAL_BASED_OUTPATIENT_CLINIC_OR_DEPARTMENT_OTHER): Payer: Self-pay

## 2020-09-15 ENCOUNTER — Ambulatory Visit (HOSPITAL_BASED_OUTPATIENT_CLINIC_OR_DEPARTMENT_OTHER)
Admission: RE | Admit: 2020-09-15 | Discharge: 2020-09-15 | Disposition: A | Discharge status: Home / Self Care | Payer: Medicare HMO | Source: Ambulatory Visit | Attending: Orthopedic Surgery | Admitting: Orthopedic Surgery

## 2020-09-15 ENCOUNTER — Other Ambulatory Visit: Payer: Self-pay

## 2020-09-15 DIAGNOSIS — K7689 Other specified diseases of liver: Secondary | ICD-10-CM | POA: Diagnosis present

## 2020-12-06 ENCOUNTER — Other Ambulatory Visit (HOSPITAL_BASED_OUTPATIENT_CLINIC_OR_DEPARTMENT_OTHER): Payer: Self-pay

## 2020-12-06 ENCOUNTER — Ambulatory Visit: Payer: Medicare HMO | Attending: Internal Medicine

## 2020-12-06 DIAGNOSIS — Z23 Encounter for immunization: Secondary | ICD-10-CM

## 2020-12-06 MED ORDER — PFIZER-BIONT COVID-19 VAC-TRIS 30 MCG/0.3ML IM SUSP
INTRAMUSCULAR | 0 refills | Status: AC
  Filled 2020-12-06: qty 0.3, 1d supply, fill #0

## 2020-12-06 NOTE — Progress Notes (Signed)
   Covid-19 Vaccination Clinic  Name:  Olivia Simpson    MRN: [id number] DOB: 1950/10/02  12/06/2020  [name] was observed post Covid-19 immunization for 15 minutes without incident. She was provided with Vaccine Information Sheet and instruction to access the V-Safe system.   [name] was instructed to call 911 with any severe reactions post vaccine: Difficulty breathing  Swelling of face and throat  A fast heartbeat  A bad rash all over body  Dizziness and weakness   Immunizations Administered     Name Date Dose VIS Date Route   PFIZER Comrnaty(Gray TOP) Covid-19 Vaccine 12/06/2020 10:50 AM 0.3 mL 03/24/2020 Intramuscular   Manufacturer: [employer], [employer]   Lot: [id number]   NDC: [phone]

## 2020-12-20 DIAGNOSIS — I517 Cardiomegaly: Secondary | ICD-10-CM | POA: Diagnosis not present

## 2020-12-27 ENCOUNTER — Ambulatory Visit
Admission: RE | Admit: 2020-12-27 | Discharge: 2020-12-27 | Disposition: A | Discharge status: Home / Self Care | Payer: Medicare HMO | Source: Ambulatory Visit | Attending: Family | Admitting: Family

## 2020-12-27 ENCOUNTER — Other Ambulatory Visit: Payer: Self-pay

## 2020-12-27 DIAGNOSIS — E2839 Other primary ovarian failure: Secondary | ICD-10-CM

## 2021-01-18 IMAGING — CR PELVIS - 1-2 VIEW
1 series · 1 of 1 positions shown · non-contrast
Comparison: CT 04/27/2018

CLINICAL DATA: Pain from right thigh down to right lower leg. No
injury.

EXAM:
PELVIS - 1-2 VIEW

[pelvis ap]
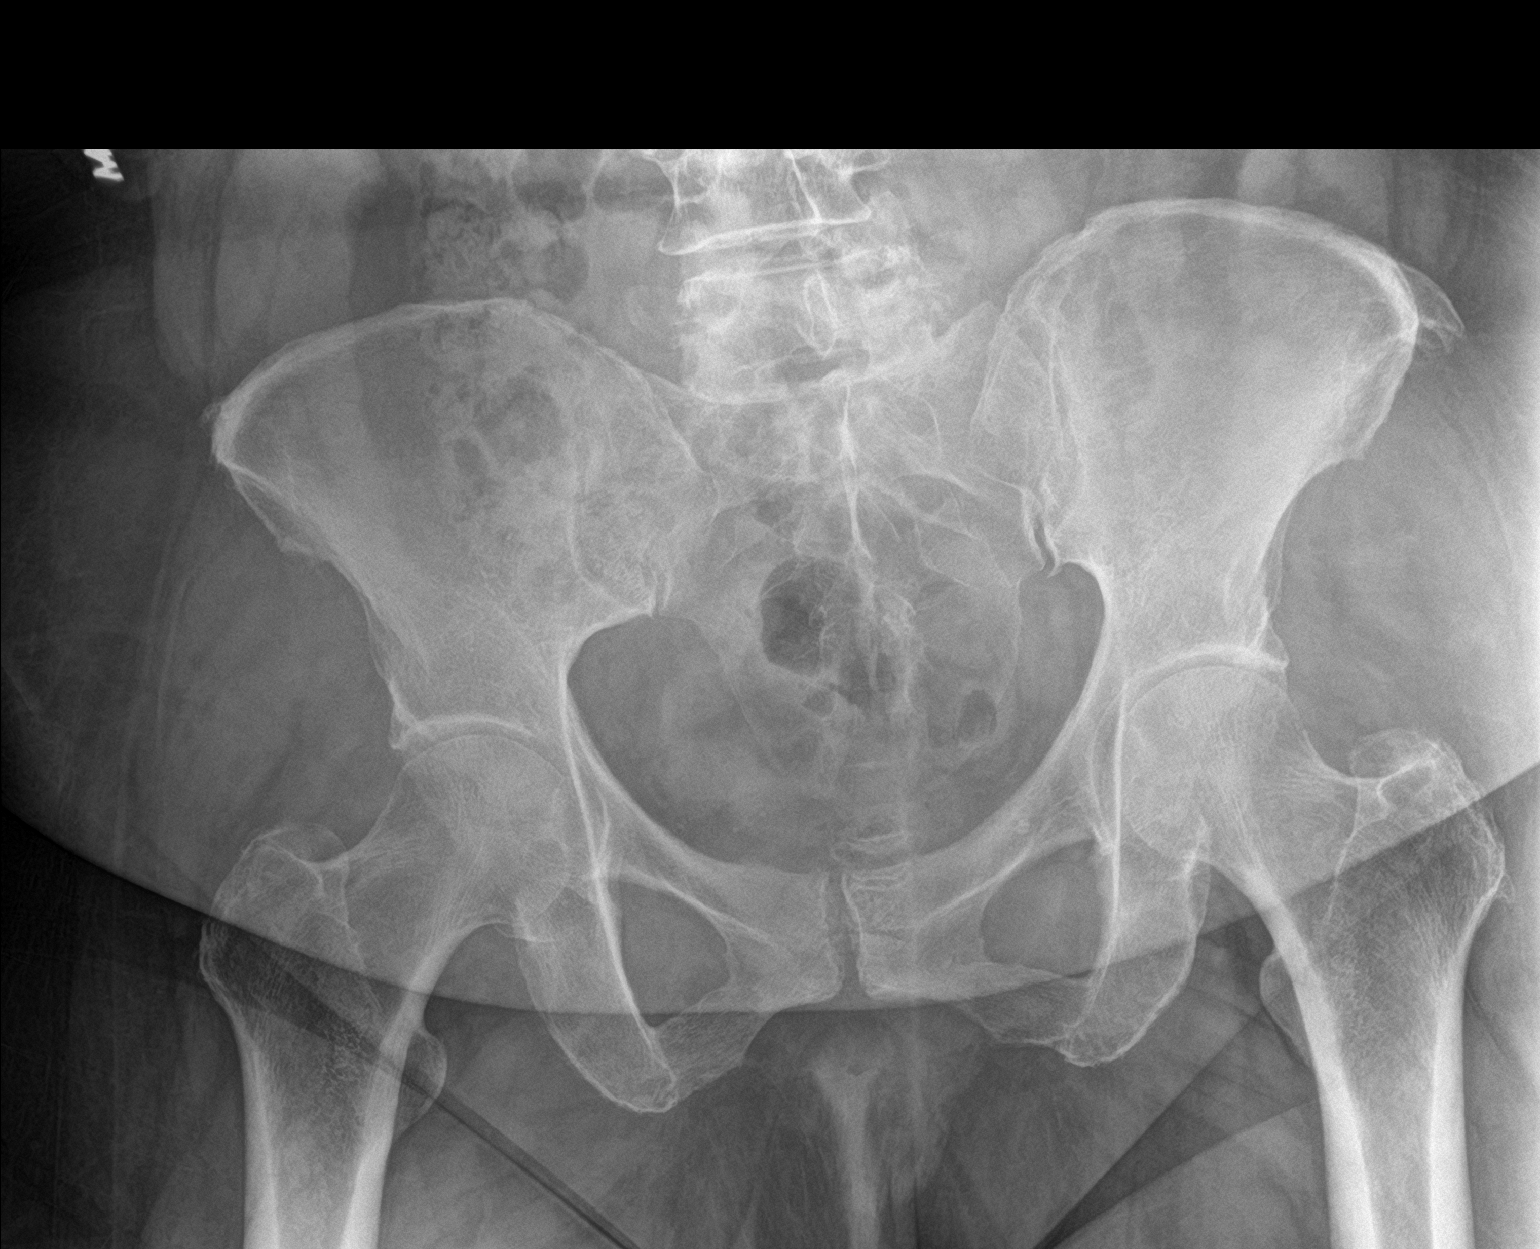

[1 of 1 positions shown; findings below may reference images not displayed]

FINDINGS: Minimal symmetric degenerative change of the hips. No evidence of
acute fracture or dislocation. No lytic or sclerotic lesions.
Degenerative change of the spine.
IMPRESSION: No acute findings.

Minimal symmetric degenerative change of the hips.

## 2021-11-22 ENCOUNTER — Encounter (INDEPENDENT_AMBULATORY_CARE_PROVIDER_SITE_OTHER): Payer: Self-pay

## 2022-07-16 ENCOUNTER — Telehealth: Payer: Self-pay | Admitting: Family Medicine

## 2022-07-17 ENCOUNTER — Other Ambulatory Visit: Payer: Self-pay

## 2022-09-18 ENCOUNTER — Other Ambulatory Visit: Payer: Self-pay | Admitting: Family Medicine

## 2022-12-04 ENCOUNTER — Institutional Professional Consult (permissible substitution): Payer: Medicare PPO | Admitting: Primary Care

## 2022-12-30 IMAGING — US US ABDOMEN LIMITED
1 series · 14 of 25 positions shown · non-contrast
Comparison: Right upper quadrant abdominal ultrasound 04/27/2018.
CT abdomen and pelvis 04/27/2018.

CLINICAL DATA: Follow-up hepatic cyst.

EXAM:
ULTRASOUND ABDOMEN LIMITED RIGHT UPPER QUADRANT

[Series 1: us abdomen limited ruq (liver/gb) · 14 of 40 slices shown]
[im 1/40]
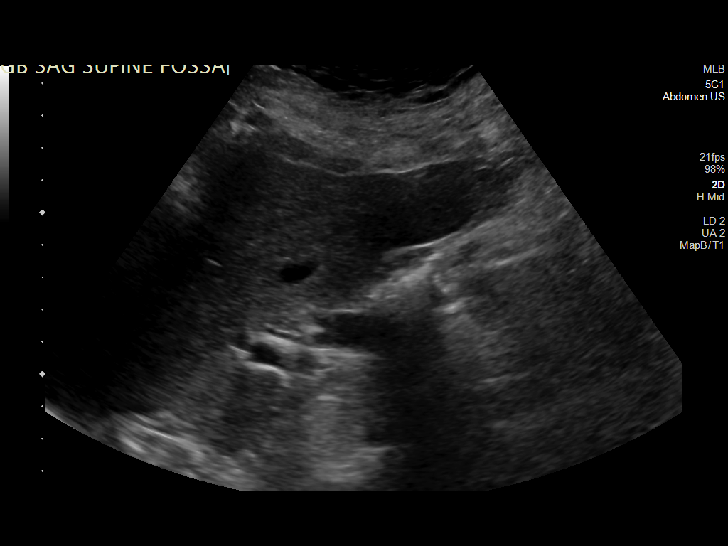
[im 4/40]
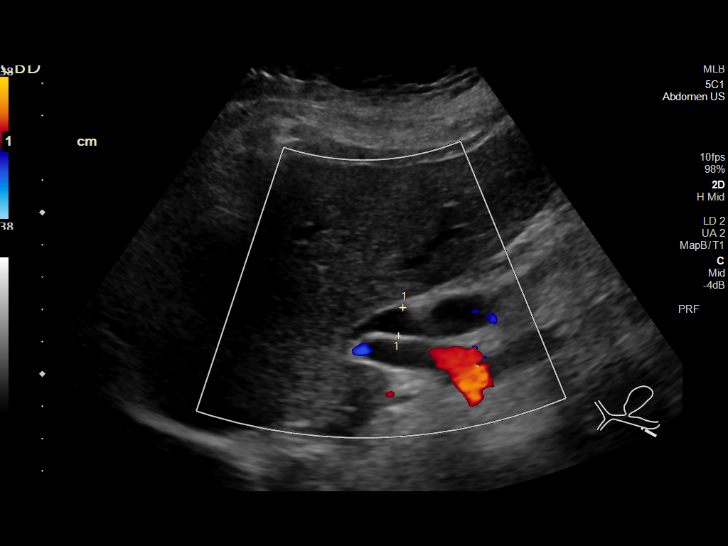
[im 7/40]
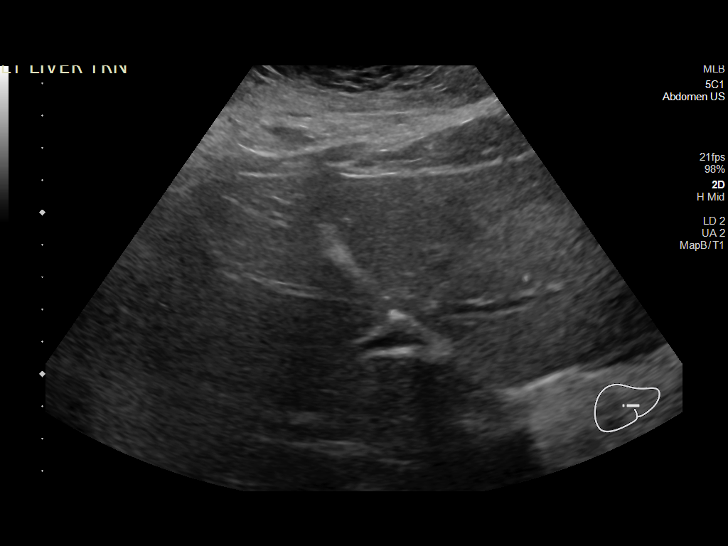
[im 10/40]
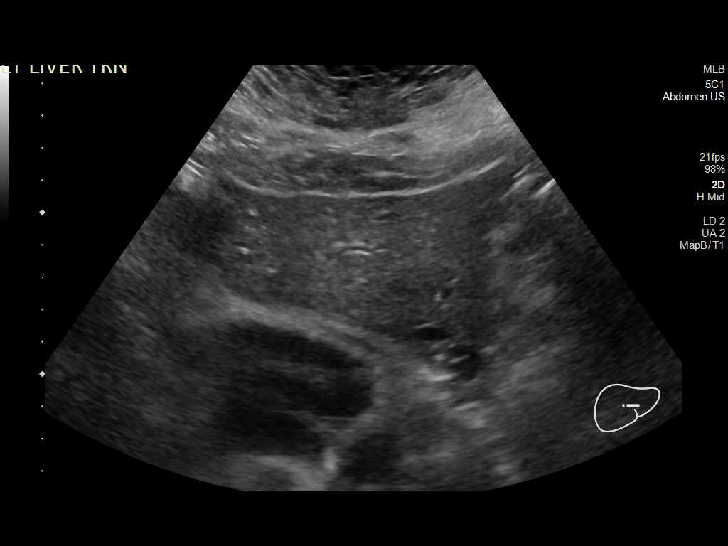
[im 14/40]
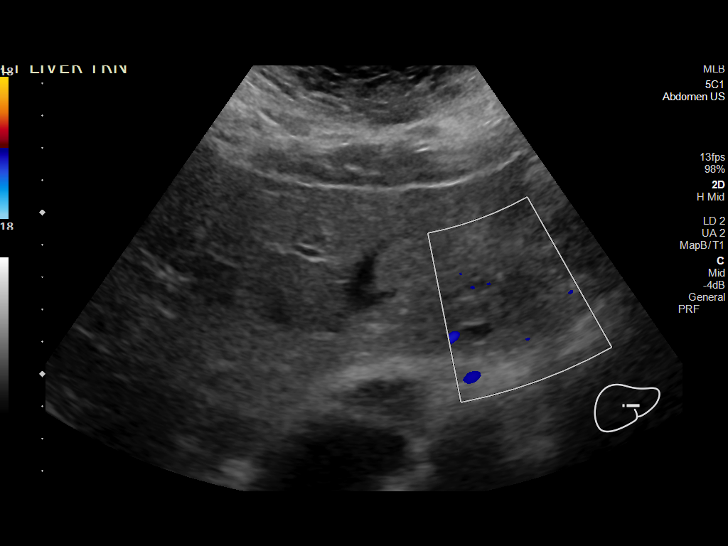
[im 15/40]
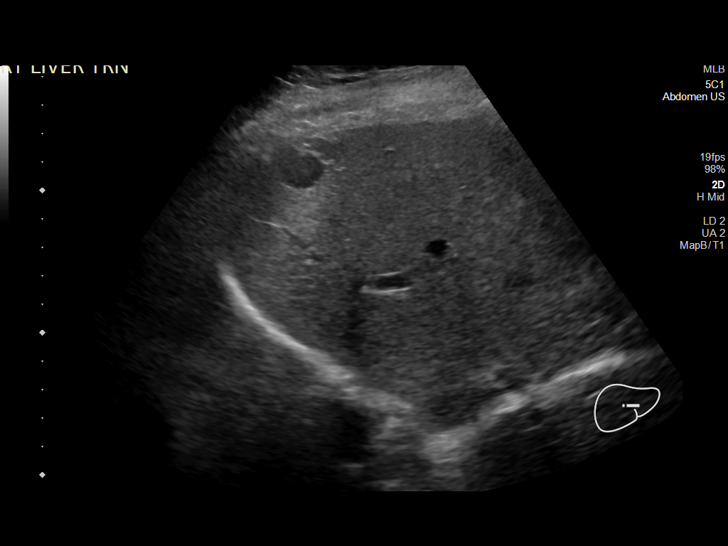
[im 18/40]
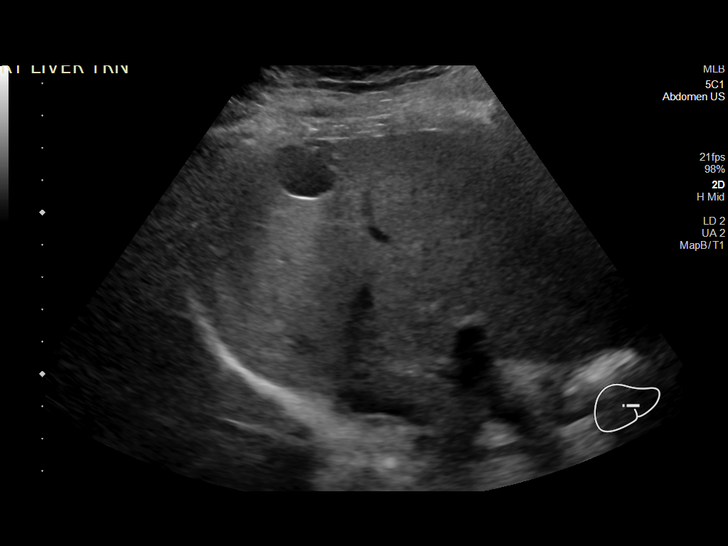
[im 22/40]
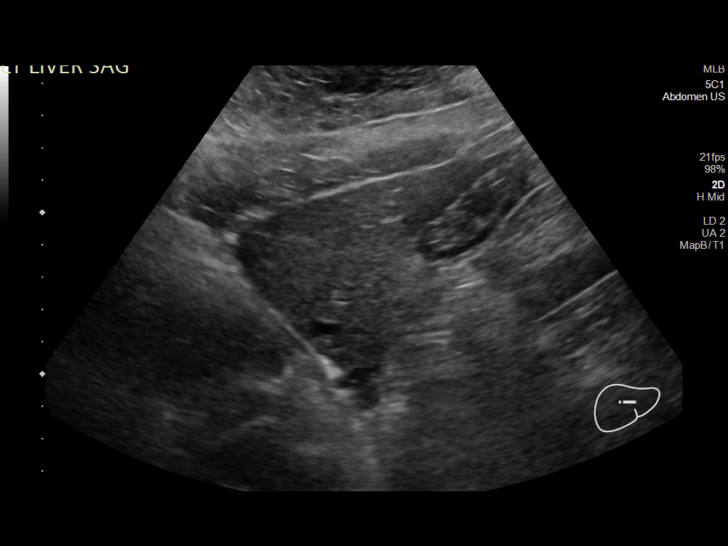
[im 25/40]
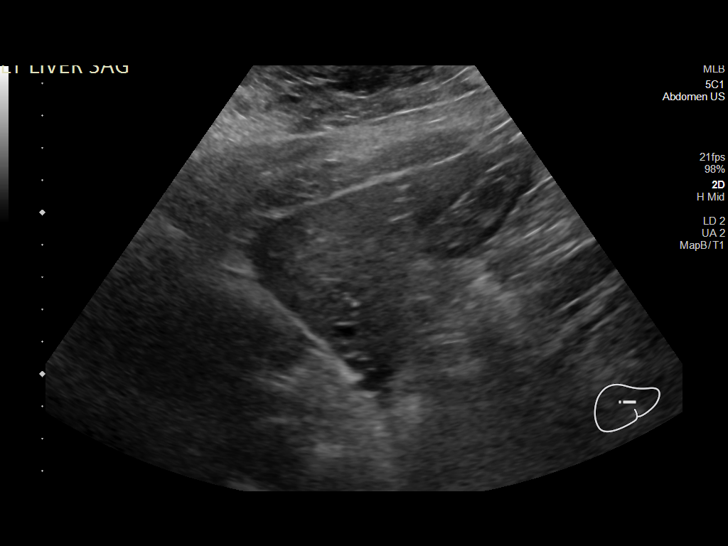
[im 27/40]
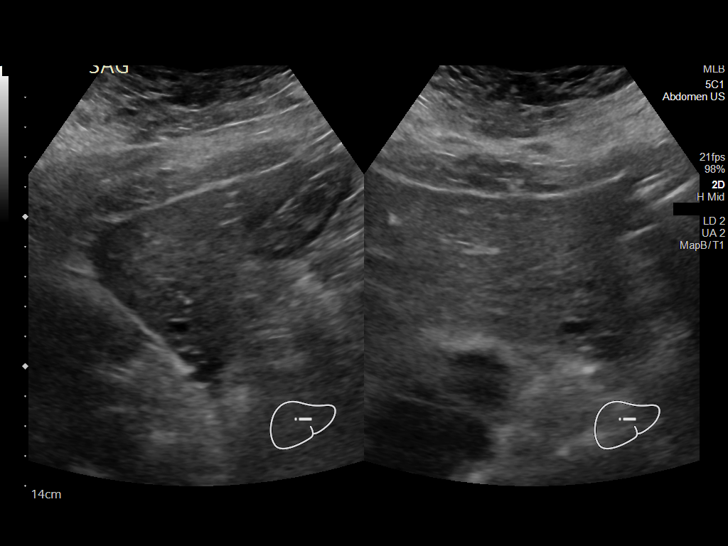
[im 30/40]
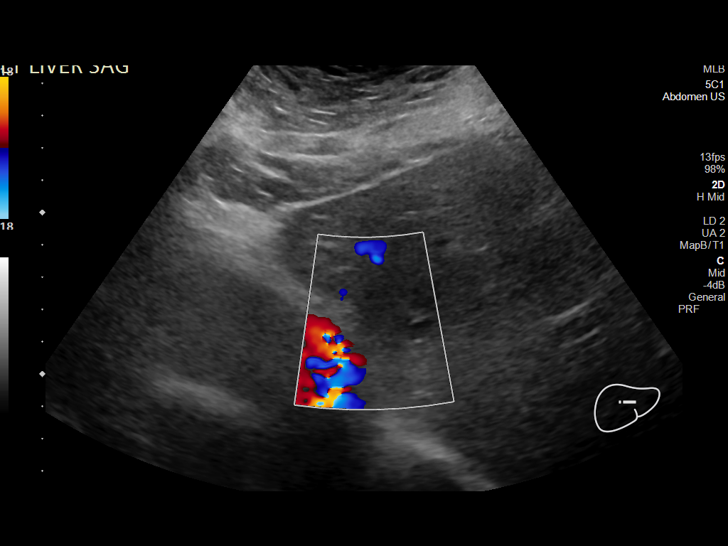
[im 33/40]
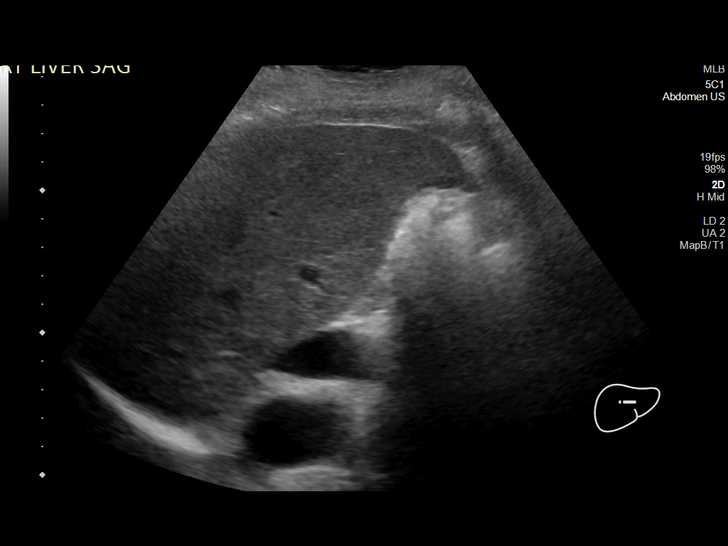
[im 36/40]
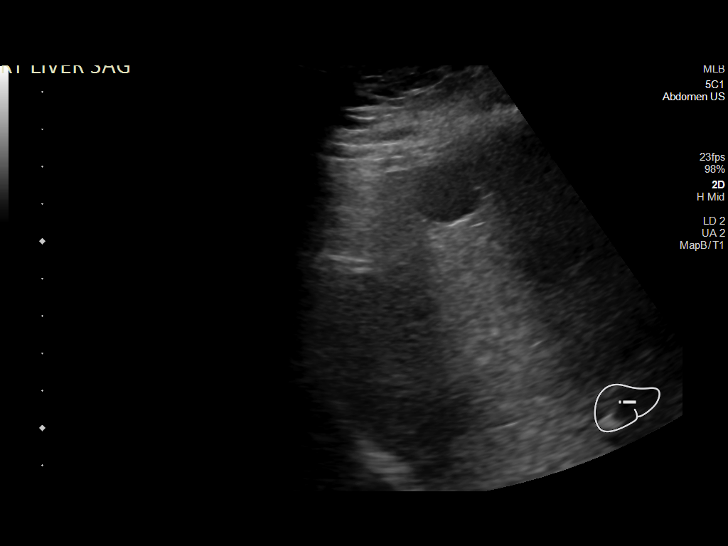
[im 40/40]
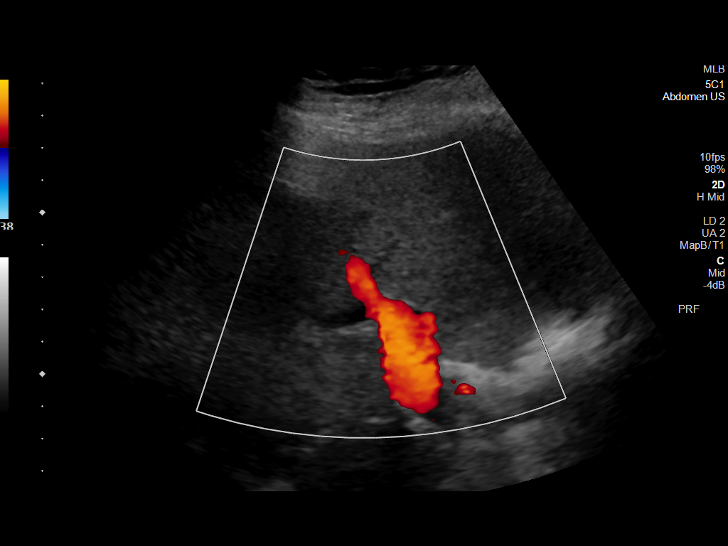

[14 of 25 positions shown; findings below may reference images not displayed]

FINDINGS: Gallbladder:

Interval cholecystectomy.

Common bile duct:

Diameter: 9 mm, within normal limits following cholecystectomy

Liver:

Normal background parenchymal echogenicity. Multiple hepatic cysts
as seen on the prior CT with the largest measuring 1.9 cm in the
right hepatic lobe and without significant interval enlargement.
Portal vein is patent on color Doppler imaging with normal direction
of blood flow towards the liver.

Other: None.
IMPRESSION: 1. Multiple hepatic cysts. The dominant cyst has not enlarged, and
no suspicious liver mass is identified.
2. Status post cholecystectomy. No significant biliary dilatation.

## 2023-01-07 ENCOUNTER — Institutional Professional Consult (permissible substitution): Payer: Medicare PPO | Admitting: Primary Care

## 2023-05-19 NOTE — Progress Notes (Signed)
  Cardiology Office Note:  .   Date:  05/29/2023  ID:  Olivia Simpson, DOB 11/07/1950, MRN [id number] PCP: Olivia Emery, NP  [facility] Providers Cardiologist:  None {  History of Present Illness: .     Feb. 12, 2025  Olivia Simpson is a 73 y.o. female with hx of chest pain / tightness , HTN    Has had some chest heaviness  Lasted several days  Was sent to [facility] ER  CPK was 300 at the ER  She was having lots of stress,  her husband was battling cancer  Her husband has passed away and she has not had any chest tightlness / fullness since then .    Walks every day  Rides her stationary bike  No CP or tightness with exercise        ROS:   Studies Reviewed: [name]   EKG Interpretation Date/Time:  Wednesday May 29 2023 15:06:57 EST Ventricular Rate:  62 PR Interval:  174 QRS Duration:  94 QT Interval:  420 QTC Calculation: 426 R Axis:   1  Text Interpretation: Normal sinus rhythm Cannot rule out Anterior infarct , age undetermined When compared with ECG of 27-Apr-2018 08:16, PREVIOUS ECG IS PRESENT Confirmed by Kristeen Miss [phone]) on 05/29/2023 3:35:58 PM     Risk Assessment/Calculations:             Physical Exam:   VS:  BP 116/78   Pulse 64   Ht 5\' 3"  (1.6 m)   Wt 215 lb (97.5 kg)   SpO2 97%   BMI 38.09 kg/m    Wt Readings from Last 3 Encounters:  05/29/23 215 lb (97.5 kg)  08/06/19 227 lb (103 kg)  07/21/19 227 lb (103 kg)    GEN: Well nourished, well developed in no acute distress NECK: No JVD; No carotid bruits CARDIAC: RRR, no murmurs, rubs, gallops RESPIRATORY:  Clear to auscultation without rales, wheezing or rhonchi  ABDOMEN: Soft, non-tender, non-distended EXTREMITIES:  No edema; No deformity   ASSESSMENT AND PLAN: .   1.  Chest tightness: Olivia Simpson presents for further evaluation of some chest tightness.  The pain lasted for several days.  She was seen by her Olivia Simpson.  She was sent to [facility] ER.  CPK  levels were found to be elevated at 300 but I do not see any CPK-MB.  I do not see any troponin levels.  She has not had any episodes since that time. ECG reveals poor R wave progression-cannot rule out an anterior wall myocardial infarction versus lead placement.  I like to do a coronary CT angiogram for further evaluation of this chest tightness.  Will plan on seeing her on an as-needed basis.  If her CTA of the coronaries is abnormal then we will need to discuss heart catheterization.       Dispo: PRN   Signed, Kristeen Miss, MD

## 2023-05-29 ENCOUNTER — Encounter: Payer: Self-pay | Admitting: Cardiovascular Disease

## 2023-05-29 ENCOUNTER — Ambulatory Visit: Payer: Medicare PPO | Attending: Cardiovascular Disease | Admitting: Cardiovascular Disease

## 2023-05-29 VITALS — BP 116/78 | HR 64 | Ht 63.0 in | Wt 215.0 lb

## 2023-05-29 DIAGNOSIS — I10 Essential (primary) hypertension: Secondary | ICD-10-CM | POA: Diagnosis not present

## 2023-05-29 DIAGNOSIS — R0789 Other chest pain: Secondary | ICD-10-CM | POA: Diagnosis not present

## 2023-05-29 DIAGNOSIS — Z01810 Encounter for preprocedural cardiovascular examination: Secondary | ICD-10-CM

## 2023-05-29 MED ORDER — METOPROLOL TARTRATE 50 MG PO TABS: ORAL_TABLET | ORAL | 0 refills | Status: AC

## 2023-05-29 NOTE — Patient Instructions (Addendum)
Lab Work: Nutritional therapist  If you have labs (blood work) drawn today and your tests are completely normal, you will receive your results only by: [employer] (if you have MyChart) OR A paper copy in the mail If you have any lab test that is abnormal or we need to change your treatment, we will call you to review the results.  Testing/Procedures: Coronary CT Angiogram  Your physician has requested that you have cardiac CT. Cardiac computed tomography (CT) is a painless test that uses an x-ray machine to take clear, detailed pictures of your heart. For further information please visit [email or web address] Please follow instruction sheet as given.  Follow-Up: At [facility], you and your health needs are our priority.  As part of our continuing mission to provide you with exceptional heart care, we have created designated Provider Care Teams.  These Care Teams include your primary Cardiologist (physician) and Advanced Practice Providers (APPs -  Physician Assistants and Nurse Practitioners) who all work together to provide you with the care you need, when you need it.  Your next appointment:   As Needed  Provider:   Kristeen Miss, MD  Other Instructions   Your cardiac CT will be scheduled at:   [facility] [address] [place], [place] [address] [phone]  please arrive at the [facility] (Entrance C2) of [facility] 30 minutes prior to test start time. You can use the FREE valet parking offered at entrance C (encouraged to control the heart rate for the test)  Proceed to the [facility] Radiology Department (first floor) to check-in and test prep.  All radiology patients and guests should use entrance C2 at [facility], accessed from [facility], even though the hospital's physical address listed is [address].     Please follow these instructions carefully (unless otherwise directed):  An IV will  be required for this test and Nitroglycerin will be given.   On the Night Before the Test: Be sure to Drink plenty of water. Do not consume any caffeinated/decaffeinated beverages or chocolate 12 hours prior to your test. Do not take any antihistamines 12 hours prior to your test.  On the Day of the Test: Drink plenty of water until 1 hour prior to the test. Do not eat any food 1 hour prior to test. You may take your regular medications prior to the test.  Take metoprolol (Lopressor) two hours prior to test. please HOLD Lisinopril/HCTZ on the morning of the test. Patients who wear a continuous glucose monitor MUST remove the device prior to scanning. FEMALES- please wear underwire-free bra if available, avoid dresses & tight clothing      After the Test: Drink plenty of water. After receiving IV contrast, you may experience a mild flushed feeling. This is normal. On occasion, you may experience a mild rash up to 24 hours after the test. This is not dangerous. If this occurs, you can take Benadryl 25 mg, Zyrtec, Claritin, or Allegra and increase your fluid intake. (Patients taking Tikosyn should avoid Benadryl, and may take Zyrtec, Claritin, or Allegra) If you experience trouble breathing, this can be serious. If it is severe call 911 IMMEDIATELY. If it is mild, please call our office.  We will call to schedule your test 2-4 weeks out understanding that some insurance companies will need an authorization prior to the service being performed.   For more information and frequently asked questions, please visit our website :  [email or web address]  For non-scheduling related questions, please contact the cardiac imaging nurse navigator should you have any questions/concerns: Cardiac Imaging Nurse Navigators Direct Office Dial: [phone]   For scheduling needs, including cancellations and rescheduling, please call [place], [phone].

## 2023-06-13 ENCOUNTER — Telehealth (HOSPITAL_COMMUNITY): Payer: Self-pay | Admitting: *Deleted

## 2023-06-13 NOTE — Telephone Encounter (Signed)
 Reaching out to patient to offer assistance regarding upcoming cardiac imaging study; pt verbalizes understanding of appt date/time, parking situation and where to check in, pre-test NPO status and medications ordered, and verified current allergies; name and call back number provided for further questions should they arise  Larey Brick RN Navigator Cardiac Imaging [facility] [phone] office [phone] cell  Patient to hold her 50mg  metoprolol tartrate if her HR is less than 65 bpm. She is aware to arrive at 11:30 AM.

## 2023-06-14 ENCOUNTER — Encounter (HOSPITAL_COMMUNITY): Payer: Self-pay

## 2023-06-14 ENCOUNTER — Emergency Department (HOSPITAL_COMMUNITY)
Admission: EM | Admit: 2023-06-14 | Discharge: 2023-06-15 | Disposition: A | Discharge status: Home / Self Care | Payer: Medicare PPO | Attending: Emergency Medicine | Admitting: Emergency Medicine

## 2023-06-14 ENCOUNTER — Other Ambulatory Visit: Payer: Self-pay

## 2023-06-14 ENCOUNTER — Ambulatory Visit (HOSPITAL_COMMUNITY)
Admission: EM | Admit: 2023-06-14 | Discharge: 2023-06-14 | Disposition: A | Discharge status: Home / Self Care | Payer: Medicare PPO

## 2023-06-14 ENCOUNTER — Ambulatory Visit (EMERGENCY_DEPARTMENT_HOSPITAL)
Admission: RE | Admit: 2023-06-14 | Discharge: 2023-06-14 | Disposition: A | Discharge status: Home / Self Care | Payer: Medicare PPO | Source: Ambulatory Visit | Attending: Cardiovascular Disease | Admitting: Cardiovascular Disease

## 2023-06-14 DIAGNOSIS — Z79899 Other long term (current) drug therapy: Secondary | ICD-10-CM | POA: Diagnosis not present

## 2023-06-14 DIAGNOSIS — I10 Essential (primary) hypertension: Secondary | ICD-10-CM | POA: Insufficient documentation

## 2023-06-14 DIAGNOSIS — R1031 Right lower quadrant pain: Secondary | ICD-10-CM | POA: Diagnosis not present

## 2023-06-14 DIAGNOSIS — R112 Nausea with vomiting, unspecified: Secondary | ICD-10-CM | POA: Diagnosis not present

## 2023-06-14 DIAGNOSIS — Z7982 Long term (current) use of aspirin: Secondary | ICD-10-CM | POA: Diagnosis not present

## 2023-06-14 DIAGNOSIS — I2510 Atherosclerotic heart disease of native coronary artery without angina pectoris: Secondary | ICD-10-CM | POA: Diagnosis not present

## 2023-06-14 DIAGNOSIS — R0789 Other chest pain: Secondary | ICD-10-CM

## 2023-06-14 LAB — COMPREHENSIVE METABOLIC PANEL
ALT: 16 U/L (ref 0–44)
AST: 21 U/L (ref 15–41)
Albumin: 4 g/dL (ref 3.5–5.0)
Alkaline Phosphatase: 51 U/L (ref 38–126)
Anion gap: 12 (ref 5–15)
BUN: 14 mg/dL (ref 8–23)
CO2: 21 mmol/L — ABNORMAL LOW (ref 22–32)
Calcium: 9.8 mg/dL (ref 8.9–10.3)
Chloride: 103 mmol/L (ref 98–111)
Creatinine, Ser: 0.92 mg/dL (ref 0.44–1.00)
GFR, Estimated: >60 mL/min (ref >60)
Glucose, Bld: 130 mg/dL — ABNORMAL HIGH (ref 70–99)
Potassium: 3.1 mmol/L — ABNORMAL LOW (ref 3.5–5.1)
Sodium: 136 mmol/L (ref 135–145)
Total Bilirubin: 0.8 mg/dL (ref 0.0–1.2)
Total Protein: 7.7 g/dL (ref 6.5–8.1)

## 2023-06-14 LAB — CBC
HCT: 37.6 % (ref 36.0–46.0)
Hemoglobin: 12.9 g/dL (ref 12.0–15.0)
MCH: 29.1 pg (ref 26.0–34.0)
MCHC: 34.3 g/dL (ref 30.0–36.0)
MCV: 84.7 fL (ref 80.0–100.0)
Platelets: 199 10*3/uL (ref 150–400)
RBC: 4.44 MIL/uL (ref 3.87–5.11)
RDW: 14.4 % (ref 11.5–15.5)
WBC: 8.5 10*3/uL (ref 4.0–10.5)
nRBC: 0 % (ref 0.0–0.2)

## 2023-06-14 LAB — LIPASE, BLOOD: Lipase: 28 U/L (ref 11–51)

## 2023-06-14 MED ORDER — IOHEXOL 350 MG/ML SOLN
95.0000 mL | Freq: Once | INTRAVENOUS | Status: AC | PRN
  Administered 2023-06-14: 95 mL via INTRAVENOUS

## 2023-06-14 MED ORDER — MORPHINE SULFATE (PF) 4 MG/ML IV SOLN
4.0000 mg | Freq: Once | INTRAVENOUS | Status: AC
  Administered 2023-06-14: 4 mg via INTRAVENOUS
  Filled 2023-06-14: qty 1

## 2023-06-14 MED ORDER — OXYCODONE-ACETAMINOPHEN 5-325 MG PO TABS
1.0000 | ORAL_TABLET | ORAL | Status: DC | PRN
  Administered 2023-06-14: 1 via ORAL
  Filled 2023-06-14: qty 1

## 2023-06-14 MED ORDER — ONDANSETRON HCL 4 MG/2ML IJ SOLN
4.0000 mg | Freq: Once | INTRAMUSCULAR | Status: AC | PRN
  Administered 2023-06-14: 4 mg via INTRAVENOUS
  Filled 2023-06-14: qty 2

## 2023-06-14 MED ORDER — NITROGLYCERIN 0.4 MG SL SUBL
0.8000 mg | SUBLINGUAL_TABLET | Freq: Once | SUBLINGUAL | Status: AC
  Administered 2023-06-14: 0.8 mg via SUBLINGUAL

## 2023-06-14 MED ORDER — ONDANSETRON HCL 4 MG/2ML IJ SOLN
4.0000 mg | Freq: Once | INTRAMUSCULAR | Status: AC
  Administered 2023-06-14: 4 mg via INTRAVENOUS
  Filled 2023-06-14: qty 2

## 2023-06-14 MED ORDER — NITROGLYCERIN 0.4 MG SL SUBL
SUBLINGUAL_TABLET | SUBLINGUAL | Status: AC
  Filled 2023-06-14: qty 2

## 2023-06-14 NOTE — ED Provider Notes (Signed)
 [facility]    CSN: [id number] Arrival date & time: 06/14/23  1804      History   Chief Complaint Chief Complaint  Patient presents with   Abdominal Pain   Shortness of Breath    HPI Olivia Simpson is a 73 y.o. female.   Patient presents to clinic over reports of right lower abdominal pain, nausea, vomiting and feeling unwell that started this morning.  She was n.p.o. today for a cardiac procedure where they looked at her arteries.  She did try to eat when she got home and has been vomiting nonstop.  She is having shortness of breath that started when she was in the lobby.  Emesis is yellow-green.  Reports severe abdominal pain.  The history is provided by the patient and medical records.  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   Class 3 severe obesity with serious comorbidity and body mass index (BMI) of 40.0 to 44.9 in adult (HCC) 03/04/2019   Prediabetes 03/04/2019   Hypertension    Vitamin D deficiency    Snoring     Past Surgical History:  Procedure Laterality Date   BREAST EXCISIONAL BIOPSY Right    BREAST LUMPECTOMY  1971   BUNIONECTOMY Right 2005   CHOLECYSTECTOMY N/A 04/28/2018   Procedure: LAPAROSCOPIC CHOLECYSTECTOMY WITH INTRAOPERATIVE CHOLANGIOGRAM;  Surgeon: Manus Rudd, MD;  Location: [facility] OR;  Service: General;  Laterality: N/A;   hammertoe removal  03/20/13   ORTHOPEDIC SURGERY Left    ORTHOPEDIC SURGERY Right    PARTIAL HYSTERECTOMY  2002    OB History     Gravida  2   Para  1   Term       Preterm      AB  1   Living  1      SAB  1   IAB      Ectopic      Multiple      Live Births               Home Medications    Prior to Admission medications   Medication Sig Start Date End Date Taking? Authorizing Provider  gabapentin (NEURONTIN) 300 MG capsule Take 300 mg by mouth 3 (three) times daily.   Yes [provider]  ibuprofen (ADVIL,MOTRIN) 600 MG tablet Take 600 mg by mouth every 6 (six) hours as needed for headache or mild pain.   Yes [provider]  lisinopril (ZESTRIL) 10 MG tablet Take 10 mg by mouth daily.   Yes [provider]  pantoprazole (PROTONIX) 40 MG tablet Take 40 mg by mouth daily. 01/27/23  Yes [provider]  ALPRAZolam (XANAX) 0.25 MG tablet Take 0.25 mg by mouth 2 (two) times daily. 04/03/23   [provider]  aspirin 81 MG tablet Take 81 mg by mouth daily.    [provider]  cetirizine (ZYRTEC) 10 MG tablet Take 10  mg by mouth daily as needed. 03/12/23   [provider]  COVID-19 mRNA Vac-TriS, Pfizer, (PFIZER-BIONT COVID-19 VAC-TRIS) SUSP injection Inject into the muscle. 12/06/20   Judyann Munson, MD  diazepam (VALIUM) 5 MG tablet Take 5 mg by mouth every 3 (three) days. 04/04/22   [provider]  dicyclomine (BENTYL) 20 MG tablet Take 20 mg by mouth every 6 (six) hours. 01/22/22   [provider]  fesoterodine (TOVIAZ) 4 MG TB24 tablet Take 4 mg by mouth daily.    [provider]  lisinopril-hydrochlorothiazide (ZESTORETIC) 20-25 MG tablet Take 1 tablet by mouth daily.    [provider]  Melatonin 5 MG CAPS Take by mouth daily.    [provider]  metoprolol tartrate (LOPRESSOR) 50 MG tablet Take 1 tablet by mouth two hours prior to scan 05/29/23   Nahser, Deloris Ping, MD  Multiple Vitamin (MULTIVITAMIN) capsule Take 1 capsule by mouth daily.    [provider]  omeprazole (PRILOSEC) 20 MG capsule Take 20 mg by mouth daily.     [provider]  Vitamin D, Ergocalciferol, (DRISDOL) 1.25 MG (50000 UNIT) CAPS capsule Take 1 capsule (50,000 Units total) by mouth every 7 (seven) days. 08/06/19   Roswell Nickel, DO    Family History Family History  Problem Relation Age of Onset   Colon cancer Father    High blood pressure Father    Pancreatic cancer Mother    Diabetes Mother    High blood pressure Mother    Diabetes Brother    Diabetes Sister     Social History Social History   Tobacco Use   Smoking status: Never   Smokeless tobacco: Never  Substance Use Topics   Alcohol use: No   Drug use: No     Allergies   Sulfa antibiotics and Codeine   Review of Systems Review of Systems  Per HPI   Physical Exam Triage Vital Signs ED Triage Vitals  Encounter Vitals Group     BP 06/14/23 1926 109/69     Systolic BP Percentile --      Diastolic BP Percentile --      Pulse Rate 06/14/23 1924 81     Resp 06/14/23 1924 (!) 40     Temp 06/14/23 1924 (!) 97.2 F (36.2 C)     Temp Source 06/14/23 1924 Oral     SpO2 06/14/23 1924 98 %     Weight --      Height --      Head Circumference --      Peak Flow --      Pain Score 06/14/23 1926 10     Pain Loc --      Pain Education --      Exclude from Growth Chart --    No data found.  Updated Vital Signs BP 109/69 (BP Location: Right Arm)   Pulse 81   Temp (!) 97.2 F (36.2 C) (Oral)   Resp (!) 40   SpO2 98%   Visual Acuity Right Eye Distance:   Left Eye Distance:   Bilateral Distance:    Right Eye Near:   Left Eye Near:    Bilateral Near:     Physical Exam Vitals and nursing note reviewed.  Constitutional:      General: She is in acute distress.  HENT:     Head: Normocephalic and atraumatic.  Cardiovascular:     Rate and Rhythm: Normal rate.  Pulmonary:  Effort: Pulmonary effort is normal.  Neurological:     Mental Status: She is alert.      UC Treatments / Results  Labs (all labs ordered are listed, but only  abnormal results are displayed) Labs Reviewed - No data to display  EKG   Radiology No results found.  Procedures Procedures (including critical care time)  Medications Ordered in UC Medications - No data to display  Initial Impression / Assessment and Plan / UC Course  I have reviewed the triage vital signs and the nursing notes.  Pertinent labs & imaging results that were available during my care of the patient were reviewed by me and considered in my medical decision making (see chart for details).  Vitals and triage reviewed, patient is tachypneic and in obvious discomfort.  She is actively vomiting on exam and unable to lay flat for abdominal exam.  Reports severe right lower quadrant pain.  Unclear what kind of cardiac procedure she has today, as I cannot see this in the notes, most likely CTA.  Lungs are vesicular, heart with regular rate and rhythm.  Abdomen is generally tender, patient reports it feels like she is bloated.  She is requesting transport via [facility].  Discussed that due to her pain tachypnea and recurrent emesis that this is a good idea in the hospital can consider advanced imaging.     Final Clinical Impressions(s) / UC Diagnoses   Final diagnoses:  Right lower quadrant abdominal pain  Nausea and vomiting, unspecified vomiting type   Discharge Instructions   None    ED Prescriptions   None    PDMP not reviewed this encounter.   Livvy Spilman, [place] N, [place] 06/14/23 [phone]

## 2023-06-14 NOTE — ED Notes (Signed)
 Patient is being discharged from the Urgent Care and sent to the Emergency Department via [facility] . Per [place] Garrison NP, patient is in need of higher level of care due to tachypnea, RLQ Pain, and vomiting. Patient is aware and verbalizes understanding of plan of care.  Vitals:   06/14/23 1924 06/14/23 1926  BP:  109/69  Pulse: 81   Resp: (!) 40   Temp: (!) 97.2 F (36.2 C)   SpO2: 98%

## 2023-06-14 NOTE — ED Notes (Addendum)
 Pt c/o LRQ pain since 1330 that radiates to the LLQ and lower back. Pt describes the pain as a "sharp" and "stabbing" and rates it 10/10. N/V x1. Teg[place] MD notified.

## 2023-06-14 NOTE — ED Notes (Addendum)
 [facility] called and in route and the ED charge nurse was notified as well.

## 2023-06-14 NOTE — ED Triage Notes (Signed)
 Pt is coming in with right sided lower quandrant/leg pain that radiates into her leg, this started today which brought her to the UC, With this pain she is having some nausea/vomiting/and Tachypnea.   UC NOTE:  Pt c/o RLQ abdominal pain,diarrhea, shortness of breath, weakness, and vomiting. The pain radiates into her groin, hip, and buttocks. Pt states she started spitting up "bitter stuff." Pt has not anything to eat today due to a study at the [facility]

## 2023-06-14 NOTE — ED Notes (Signed)
 Pt fell last in October and landed on her arm.

## 2023-06-14 NOTE — ED Triage Notes (Addendum)
 Pt c/o RLQ abdominal pain,diarrhea, shortness of breath, weakness, and vomiting. The pain radiates into her groin, hip, and buttocks. Pt states she started spitting up "bitter stuff." Pt has not anything to eat today due to a study at the hospital.   Start date: 06/14/2023

## 2023-06-14 NOTE — ED Provider Notes (Signed)
 [place] [facility]MENT AT [facility] Provider Note   CSN: [id number] Arrival date & time: 06/14/23  1957     History  Chief Complaint  Patient presents with   Leg Pain    Olivia Simpson is a 73 y.o. female.  The history is provided by the patient and medical records.  Leg Pain  73 year old female with history of hypertension, arthritis, vitamin D deficiency, IBS, presenting to the ED with lower abdominal pain.  Patient woke up this morning and had a little bit of diarrhea, some mild cramping in her lower abdomen but actually felt okay.  She had to fast today as she had a coronary CT with cardiology.  Once procedure was done she went home had acute worsening of pain in her right lower abdomen and has had continuous vomiting since then.  Pain seems to radiate towards the hip/groin area.  She denies any fever or chills.  States pain is quite severe now.  Home Medications Prior to Admission medications   Medication Sig Start Date End Date Taking? Authorizing Provider  ondansetron (ZOFRAN-ODT) 4 MG disintegrating tablet Take 1 tablet (4 mg total) by mouth every 8 (eight) hours as needed. 06/15/23  Yes Garlon Hatchet, PA-C  oxyCODONE-acetaminophen (PERCOCET) 5-325 MG tablet Take 1 tablet by mouth every 4 (four) hours as needed. 06/15/23  Yes Garlon Hatchet, PA-C  ALPRAZolam Prudy Feeler) 0.25 MG tablet Take 0.25 mg by mouth 2 (two) times daily. 04/03/23   [provider]  aspirin 81 MG tablet Take 81 mg by mouth daily.    [provider]  cetirizine (ZYRTEC) 10 MG tablet Take 10 mg by mouth daily as needed. 03/12/23   [provider]  COVID-19 mRNA Vac-TriS, Pfizer, (PFIZER-BIONT COVID-19 VAC-TRIS) SUSP injection Inject into the muscle. 12/06/20   Judyann Munson, MD  diazepam (VALIUM) 5 MG tablet Take 5 mg by mouth every 3 (three) days. 04/04/22   [provider]  dicyclomine (BENTYL) 20 MG tablet Take 20 mg by mouth every 6 (six) hours.  01/22/22   [provider]  fesoterodine (TOVIAZ) 4 MG TB24 tablet Take 4 mg by mouth daily.    [provider]  gabapentin (NEURONTIN) 300 MG capsule Take 300 mg by mouth 3 (three) times daily.    [provider]  ibuprofen (ADVIL,MOTRIN) 600 MG tablet Take 600 mg by mouth every 6 (six) hours as needed for headache or mild pain.    [provider]  lisinopril (ZESTRIL) 10 MG tablet Take 10 mg by mouth daily.    [provider]  lisinopril-hydrochlorothiazide (ZESTORETIC) 20-25 MG tablet Take 1 tablet by mouth daily.    [provider]  Melatonin 5 MG CAPS Take by mouth daily.    [provider]  metoprolol tartrate (LOPRESSOR) 50 MG tablet Take 1 tablet by mouth two hours prior to scan 05/29/23   Nahser, Deloris Ping, MD  Multiple Vitamin (MULTIVITAMIN) capsule Take 1 capsule by mouth daily.    [provider]  omeprazole (PRILOSEC) 20 MG capsule Take 20 mg by mouth daily.    [provider]  pantoprazole (PROTONIX) 40 MG tablet Take 40 mg by mouth daily. 01/27/23   [provider]  Vitamin D, Ergocalciferol, (DRISDOL) 1.25 MG (50000 UNIT) CAPS capsule Take 1 capsule (50,000 Units total) by mouth every 7 (seven) days. 08/06/19   Corinna Capra A, DO      Allergies    Sulfa antibiotics and Codeine  Review of Systems   Review of Systems  Gastrointestinal:  Positive for abdominal pain, diarrhea, nausea and vomiting.  All other systems reviewed and are negative.   Physical Exam Updated Vital Signs BP (!) 99/54 (BP Location: Right Arm)   Pulse 83   Temp 98.7 F (37.1 C) (Oral)   Resp 18   SpO2 95%   Physical Exam Vitals and nursing note reviewed.  Constitutional:      Appearance: She is well-developed.     Comments: Appears uncomfortable, dry heaving on exam  HENT:     Head: Normocephalic and atraumatic.  Eyes:     Conjunctiva/sclera: Conjunctivae normal.     Pupils: Pupils are equal, round, and  reactive to light.  Cardiovascular:     Rate and Rhythm: Normal rate and regular rhythm.     Heart sounds: Normal heart sounds.  Pulmonary:     Effort: Pulmonary effort is normal.     Breath sounds: Normal breath sounds.  Abdominal:     General: Bowel sounds are normal.     Palpations: Abdomen is soft.     Tenderness: There is abdominal tenderness in the right lower quadrant.       Comments: Tender RLQ, guarding on exam  Musculoskeletal:        General: Normal range of motion.     Cervical back: Normal range of motion.  Skin:    General: Skin is warm and dry.  Neurological:     Mental Status: She is alert and oriented to person, place, and time.     ED Results / Procedures / Treatments   Labs (all labs ordered are listed, but only abnormal results are displayed) Labs Reviewed  COMPREHENSIVE METABOLIC PANEL - Abnormal; Notable for the following components:      Result Value   Potassium 3.1 (*)    CO2 21 (*)    Glucose, Bld 130 (*)    All other components within normal limits  URINALYSIS, ROUTINE W REFLEX MICROSCOPIC - Abnormal; Notable for the following components:   Specific Gravity, Urine 1.038 (*)    Ketones, ur 80 (*)    All other components within normal limits  LIPASE, BLOOD  CBC    EKG None  Radiology CT ABDOMEN PELVIS W CONTRAST Result Date: 06/15/2023 CLINICAL DATA:  Right lower quadrant pain EXAM: CT ABDOMEN AND PELVIS WITH CONTRAST TECHNIQUE: Multidetector CT imaging of the abdomen and pelvis was performed using the standard protocol following bolus administration of intravenous contrast. RADIATION DOSE REDUCTION: This exam was performed according to the departmental dose-optimization program which includes automated exposure control, adjustment of the mA and/or kV according to patient size and/or use of iterative reconstruction technique. CONTRAST:  75mL OMNIPAQUE IOHEXOL 350 MG/ML SOLN COMPARISON:  04/27/2018 FINDINGS: Lower chest: No acute abnormality.  Hepatobiliary: Scattered cysts are noted throughout the liver stable in appearance from the prior exam. The gallbladder has been. Surgically removed in the interval. Pancreas: Unremarkable. No pancreatic ductal dilatation or surrounding inflammatory changes. Spleen: Normal in size without focal abnormality. Adrenals/Urinary Tract: Adrenal glands are within normal limits. Kidneys demonstrate a normal enhancement pattern bilaterally. No calculi or obstructive changes are seen. The bladder is well distended with opacified urine from prior cardiac exam Stomach/Bowel: Minimal diverticular change of the colon is noted. The colon is predominately decompressed. The appendix is within normal limits. Small bowel and stomach are unremarkable. Vascular/Lymphatic: Aortic atherosclerosis. No enlarged abdominal or pelvic lymph nodes. Reproductive: Status post hysterectomy. No adnexal masses. Other: No  abdominal wall hernia or abnormality. No abdominopelvic ascites. Musculoskeletal: No acute or significant osseous findings. IMPRESSION: Mild diverticulosis without diverticulitis. Normal appearing appendix. Chronic changes similar to that noted on the prior exam. Electronically Signed   By: Alcide Clever M.D.   On: 06/15/2023 02:47    Procedures Procedures    Medications Ordered in ED Medications  oxyCODONE-acetaminophen (PERCOCET/ROXICET) 5-325 MG per tablet 1 tablet (1 tablet Oral Given 06/14/23 2148)  oxyCODONE-acetaminophen (PERCOCET/ROXICET) 5-325 MG per tablet 1 tablet (has no administration in time range)  ondansetron (ZOFRAN) injection 4 mg (4 mg Intravenous Given 06/14/23 2153)  morphine (PF) 4 MG/ML injection 4 mg (4 mg Intravenous Given 06/14/23 2259)  ondansetron (ZOFRAN) injection 4 mg (4 mg Intravenous Given 06/14/23 2259)  iohexol (OMNIPAQUE) 350 MG/ML injection 75 mL (75 mLs Intravenous Contrast Given 06/15/23 0217)    ED Course/ Medical Decision Making/ A&P                                 Medical  Decision Making Amount and/or Complexity of Data Reviewed Labs: ordered. Radiology: ordered and independent interpretation performed. ECG/medicine tests: ordered and independent interpretation performed.  Risk Prescription drug management.   73 year old female presenting to the ED for right lower quadrant abdominal pain.  Had some diarrhea this morning but pain is worsened throughout the day.  Had to fast for a cardiac CT.  She appears uncomfortable, seems to be writhing around in the bed.  Tender right lower quadrant.  Labs were obtained from triage without leukocytosis or electrolyte derangement.  Normal lipase.  UA is pending.  Will order CT.  Morphine and Zofran given.  UA without any signs of infection.  No hematuria.  CT without acute findings.  Patient appears much more comfortable on reassessment.  No further emesis.  She still feels like she has some discomfort.  She is given oral oxycodone.  Plan to discharge home with short course of pain and nausea medications.  She does report she has some "bad nerves" in her back.  Potentially could be related.  She is not having any new numbness or weakness of the legs.  No bowel or bladder incontinence.  No signs or symptoms concerning for cauda equina.  Will have her follow-up closely with her primary care doctor.  She can return here for new concerns.  Final Clinical Impression(s) / ED Diagnoses Final diagnoses:  Right lower quadrant abdominal pain    Rx / DC Orders ED Discharge Orders          Ordered    oxyCODONE-acetaminophen (PERCOCET) 5-325 MG tablet  Every 4 hours PRN        06/15/23 0309    ondansetron (ZOFRAN-ODT) 4 MG disintegrating tablet  Every 8 hours PRN        06/15/23 0309              Garlon Hatchet, PA-C 06/15/23 0356    Tegeler, Canary Brim, MD 06/16/23 1425

## 2023-06-15 ENCOUNTER — Emergency Department (HOSPITAL_COMMUNITY)

## 2023-06-15 ENCOUNTER — Other Ambulatory Visit (HOSPITAL_COMMUNITY)

## 2023-06-15 ENCOUNTER — Telehealth: Payer: Self-pay

## 2023-06-15 LAB — URINALYSIS, ROUTINE W REFLEX MICROSCOPIC
Bilirubin Urine: NEGATIVE
Glucose, UA: NEGATIVE mg/dL
Hgb urine dipstick: NEGATIVE
Ketones, ur: 80 mg/dL — AB
Leukocytes,Ua: NEGATIVE
Nitrite: NEGATIVE
Protein, ur: NEGATIVE mg/dL
Specific Gravity, Urine: 1.038 — ABNORMAL HIGH (ref 1.005–1.030)
pH: 7 (ref 5.0–8.0)

## 2023-06-15 MED ORDER — OXYCODONE-ACETAMINOPHEN 5-325 MG PO TABS
1.0000 | ORAL_TABLET | Freq: Once | ORAL | Status: AC
  Administered 2023-06-15: 1 via ORAL
  Filled 2023-06-15: qty 1

## 2023-06-15 MED ORDER — IOHEXOL 350 MG/ML SOLN
75.0000 mL | Freq: Once | INTRAVENOUS | Status: AC | PRN
  Administered 2023-06-15: 75 mL via INTRAVENOUS

## 2023-06-15 MED ORDER — ONDANSETRON 4 MG PO TBDP: 4.0000 mg | ORAL_TABLET | Freq: Three times a day (TID) | ORAL | 0 refills | Status: AC | PRN

## 2023-06-15 MED ORDER — OXYCODONE-ACETAMINOPHEN 5-325 MG PO TABS: 1.0000 | ORAL_TABLET | ORAL | 0 refills | Status: AC | PRN

## 2023-06-15 NOTE — Telephone Encounter (Signed)
 Pharmacy called to verify scripts since they were printed. Verified what was written and documented in prescription chart

## 2023-06-15 NOTE — Discharge Instructions (Addendum)
 CT today did not show any acute findings. Take the prescribed medication as directed. Follow-up with your doctor. Return to the ED for new or worsening symptoms.

## 2023-06-17 ENCOUNTER — Encounter: Payer: Self-pay | Admitting: Cardiovascular Disease

## 2023-11-11 ENCOUNTER — Other Ambulatory Visit (HOSPITAL_COMMUNITY): Payer: Self-pay | Admitting: Family

## 2023-11-11 DIAGNOSIS — R600 Localized edema: Secondary | ICD-10-CM

## 2023-11-13 ENCOUNTER — Encounter (HOSPITAL_COMMUNITY): Payer: Self-pay

## 2023-11-13 ENCOUNTER — Ambulatory Visit (HOSPITAL_COMMUNITY): Admission: RE | Admit: 2023-11-13 | Source: Ambulatory Visit

## 2024-01-14 ENCOUNTER — Other Ambulatory Visit (HOSPITAL_BASED_OUTPATIENT_CLINIC_OR_DEPARTMENT_OTHER): Payer: Self-pay | Admitting: Family

## 2024-01-14 DIAGNOSIS — Z780 Asymptomatic menopausal state: Secondary | ICD-10-CM
